# Patient Record
Sex: Female | Born: 1985 | Race: Black or African American | Hispanic: No | Marital: Single | Smoking: Never smoker
Health system: Southern US, Community
[De-identification: ages and names within clinical notes are randomized; demographics above are authoritative.]

## PROBLEM LIST (undated history)

## (undated) DIAGNOSIS — T7840XA Allergy, unspecified, initial encounter: Secondary | ICD-10-CM

## (undated) DIAGNOSIS — I8390 Asymptomatic varicose veins of unspecified lower extremity: Secondary | ICD-10-CM

## (undated) HISTORY — DX: Allergy, unspecified, initial encounter: T78.40XA

## (undated) HISTORY — PX: VASCULAR SURGERY: SHX849

## (undated) HISTORY — DX: Asymptomatic varicose veins of unspecified lower extremity: I83.90

---
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
## 2012-08-20 ENCOUNTER — Emergency Department (HOSPITAL_COMMUNITY)
Admission: EM | Admit: 2012-08-20 | Discharge: 2012-08-20 | Disposition: A | Discharge status: Home / Self Care | Payer: BC Managed Care – PPO | Attending: Emergency Medicine | Admitting: Emergency Medicine

## 2012-08-20 ENCOUNTER — Emergency Department (HOSPITAL_COMMUNITY): Payer: BC Managed Care – PPO

## 2012-08-20 ENCOUNTER — Encounter (HOSPITAL_COMMUNITY): Payer: Self-pay

## 2012-08-20 DIAGNOSIS — Y9239 Other specified sports and athletic area as the place of occurrence of the external cause: Secondary | ICD-10-CM | POA: Insufficient documentation

## 2012-08-20 DIAGNOSIS — W010XXA Fall on same level from slipping, tripping and stumbling without subsequent striking against object, initial encounter: Secondary | ICD-10-CM | POA: Insufficient documentation

## 2012-08-20 DIAGNOSIS — S93402A Sprain of unspecified ligament of left ankle, initial encounter: Secondary | ICD-10-CM

## 2012-08-20 DIAGNOSIS — S93409A Sprain of unspecified ligament of unspecified ankle, initial encounter: Secondary | ICD-10-CM | POA: Insufficient documentation

## 2012-08-20 DIAGNOSIS — Y92838 Other recreation area as the place of occurrence of the external cause: Secondary | ICD-10-CM | POA: Insufficient documentation

## 2012-08-20 DIAGNOSIS — Y9367 Activity, basketball: Secondary | ICD-10-CM | POA: Insufficient documentation

## 2012-08-20 MED ORDER — IBUPROFEN 600 MG PO TABS: 600.0000 mg | ORAL_TABLET | Freq: Three times a day (TID) | ORAL | Status: DC | PRN

## 2012-08-20 MED ORDER — IBUPROFEN 400 MG PO TABS
600.0000 mg | ORAL_TABLET | Freq: Once | ORAL | Status: AC
  Administered 2012-08-20: 600 mg via ORAL
  Filled 2012-08-20: qty 1

## 2012-08-20 MED ORDER — OXYCODONE-ACETAMINOPHEN 5-325 MG PO TABS: 1.0000 | ORAL_TABLET | Freq: Four times a day (QID) | ORAL | Status: DC | PRN

## 2012-08-20 MED ORDER — OXYCODONE-ACETAMINOPHEN 5-325 MG PO TABS
2.0000 | ORAL_TABLET | Freq: Once | ORAL | Status: DC
  Filled 2012-08-20: qty 2

## 2012-08-20 NOTE — ED Notes (Signed)
Palpable pulses and positive sensation below injury

## 2012-08-20 NOTE — ED Notes (Signed)
Pt c/o Right ankle pain radiating up into her calf, pt reports she was playing basket ball tonight and twisted her ankle.

## 2012-08-20 NOTE — ED Notes (Signed)
Pt called x 1 w/ no answer.   

## 2012-08-20 NOTE — ED Provider Notes (Signed)
Medical screening examination/treatment/procedure(s) were performed by non-physician practitioner and as supervising physician I was immediately available for consultation/collaboration.   Julie Manly, MD 08/20/12 0606 

## 2012-08-20 NOTE — ED Provider Notes (Cosign Needed)
   History    CSN: [id number] Arrival date & time 08/20/12  0027  First MD Initiated Contact with Patient 08/20/12 0158     Chief Complaint  Patient presents with  . Ankle Pain   (Consider location/radiation/quality/duration/timing/severity/associated sxs/prior Treatment) HPI History reviewed. No pertinent past medical history. History reviewed. No pertinent past surgical history. History reviewed. No pertinent family history. History  Substance Use Topics  . Smoking status: Never Smoker   . Smokeless tobacco: Not on file  . Alcohol Use: No   OB History   Grav Para Term Preterm Abortions TAB SAB Ect Mult Living                 Review of Systems  Allergies  Review of patient's allergies indicates no known allergies.  Home Medications   Current Outpatient Rx  Name  Route  Sig  Dispense  Refill  . ibuprofen (ADVIL,MOTRIN) 600 MG tablet   Oral   Take 1 tablet (600 mg total) by mouth every 8 (eight) hours as needed for pain.   30 tablet   0   . oxyCODONE-acetaminophen (PERCOCET/ROXICET) 5-325 MG per tablet   Oral   Take 1 tablet by mouth every 6 (six) hours as needed for pain.   18 tablet   0    BP 102/65  Pulse 82  Temp(Src) 97.8 F (36.6 C) (Oral)  Resp 18  Ht 5\' 8"  (1.727 m)  Wt 200 lb (90.719 kg)  BMI 30.42 kg/m2  SpO2 100%  LMP 07/20/2012 Physical Exam  ED Course  Procedures (including critical care time) Labs Reviewed - No data to display Dg Ankle Complete Left  08/20/2012   *RADIOLOGY REPORT*  Clinical Data: The lateral ankle pain.  LEFT ANKLE COMPLETE - 3+ VIEW  Comparison: None.  Findings: No acute bony abnormality.  Specifically, no fracture, subluxation, or dislocation.  Soft tissues are intact. Joint spaces are maintained.  Normal bone mineralization.  IMPRESSION: Negative.   Original Report Authenticated By: Charlett Nose, M.D.   1. Ankle sprain, left, initial encounter     MDM  Discussed with patient to see x-ray results.  She understands  the importance of wearing the brace for support.  For the next 4-6, weeks.  She will follow up with orthopedics as needed  Arman Filter, NP 08/20/12 0303  Arman Filter, NP 08/20/12 [phone]

## 2012-08-20 NOTE — ED Provider Notes (Signed)
History    CSN: [id number] Arrival date & time 08/20/12  0027  First MD Initiated Contact with Patient 08/20/12 0158     Chief Complaint  Patient presents with  . 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  She has not taken any medication.  Prior to arrival, but has applied ice and elevation  Patient is a 27 y.o. female presenting with ankle pain. The history is provided by the patient.  Ankle Pain Location:  Ankle Injury: yes   Ankle location:  L ankle Pain details:    Quality:  Aching   Radiates to:  Does not radiate   Severity:  Moderate Chronicity:  New Associated symptoms: no fever    History reviewed. No pertinent past medical history. History reviewed. No pertinent past surgical history. History reviewed. No pertinent family history. History  Substance Use Topics  . Smoking status: Never Smoker   . Smokeless tobacco: Not on file  . Alcohol Use: No   OB History   Grav Para Term Preterm Abortions TAB SAB Ect Mult Living                 Review of Systems  Constitutional: Negative for fever and chills.  Musculoskeletal: Positive for joint swelling.  Skin: Positive for color change.  All other systems reviewed and are negative.    Allergies  Review of patient's allergies indicates no known allergies.  Home Medications   Current Outpatient Rx  Name  Route  Sig  Dispense  Refill  . ibuprofen (ADVIL,MOTRIN) 600 MG tablet   Oral   Take 1 tablet (600 mg total) by mouth every 8 (eight) hours as needed for pain.   30 tablet   0   . oxyCODONE-acetaminophen (PERCOCET/ROXICET) 5-325 MG per tablet   Oral   Take 1 tablet by mouth every 6 (six) hours as needed for pain.   18 tablet   0    BP 102/65  Pulse 82  Temp(Src) 97.8 F (36.6 C) (Oral)  Resp 18  Ht 5\' 8"   (1.727 m)  Wt 200 lb (90.719 kg)  BMI 30.42 kg/m2  SpO2 100%  LMP 07/20/2012 Physical Exam  Nursing note and vitals reviewed. Constitutional: She appears well-developed and well-nourished.  HENT:  Head: Normocephalic.  Eyes: Pupils are equal, round, and reactive to light.  Neck: Normal range of motion.  Cardiovascular: Normal rate.   Pulmonary/Chest: Effort normal.  Musculoskeletal: She exhibits edema and tenderness.       Left ankle: She exhibits decreased range of motion and swelling. She exhibits no laceration and normal pulse. Tenderness.  Neurological: She is alert.  Skin: Skin is warm. No erythema.    ED Course  Procedures (including critical care time) Labs Reviewed - No data to display Dg Ankle Complete Left  08/20/2012   *RADIOLOGY REPORT*  Clinical Data: The lateral ankle pain.  LEFT ANKLE COMPLETE - 3+ VIEW  Comparison: None.  Findings: No acute bony abnormality.  Specifically, no fracture, subluxation, or dislocation.  Soft tissues are intact. Joint spaces are maintained.  Normal bone mineralization.  IMPRESSION: Negative.   Original Report Authenticated By: Charlett Nose, M.D.   1. Ankle sprain, left, initial encounter     MDM   Reviewed.  Patient placed in an ASO provided with crutches.  Discussed rehabilitation and rehabilitation exercises.  Follow up with orthopedics  Dondra Spry  Wynona Luna, NP 08/20/12 [phone]

## 2013-07-30 ENCOUNTER — Other Ambulatory Visit: Payer: Self-pay | Admitting: *Deleted

## 2013-07-30 DIAGNOSIS — I83893 Varicose veins of bilateral lower extremities with other complications: Secondary | ICD-10-CM

## 2013-08-14 ENCOUNTER — Encounter: Payer: Self-pay | Admitting: Vascular Surgery

## 2013-09-30 ENCOUNTER — Encounter: Payer: Self-pay | Admitting: Vascular Surgery

## 2013-10-01 ENCOUNTER — Encounter (INDEPENDENT_AMBULATORY_CARE_PROVIDER_SITE_OTHER): Payer: Self-pay

## 2013-10-01 ENCOUNTER — Encounter: Payer: Self-pay | Admitting: Vascular Surgery

## 2013-10-01 ENCOUNTER — Ambulatory Visit (INDEPENDENT_AMBULATORY_CARE_PROVIDER_SITE_OTHER): Payer: BC Managed Care – PPO | Admitting: Vascular Surgery

## 2013-10-01 ENCOUNTER — Ambulatory Visit (HOSPITAL_COMMUNITY)
Admission: RE | Admit: 2013-10-01 | Discharge: 2013-10-01 | Disposition: A | Discharge status: Home / Self Care | Payer: BC Managed Care – PPO | Source: Ambulatory Visit | Attending: Vascular Surgery | Admitting: Vascular Surgery

## 2013-10-01 VITALS — BP 113/84 | HR 83 | Ht 68.0 in | Wt 219.0 lb

## 2013-10-01 DIAGNOSIS — I83893 Varicose veins of bilateral lower extremities with other complications: Secondary | ICD-10-CM | POA: Insufficient documentation

## 2013-10-01 NOTE — Assessment & Plan Note (Signed)
This patient has significant reflux in her left greater saphenous vein and also some deep vein reflux. We've discussed the importance of intermittent leg elevation and the proper positioning for this. In addition we have discussed using compression stockings. I have written her a prescription for a knee-high stockings with a mild gradient. I've encouraged her to stay as active as possible to avoid prolonged sitting standing. If her symptoms progress I have asked her to call the office and try a thigh high compression stocking with a gradient of 20-30 mm of mercury. She could then be seen in follow up after that if needed. Our currently she did not wish to consider any further invasive treatment given that her symptoms are currently are tolerable. I quite I did not recommend sclerotherapy of her planned ectasias given that she has reflux and unless this were addressed they would be at high risk for recurrence.

## 2013-10-01 NOTE — Progress Notes (Signed)
Patient ID: Jamie Curtis, female   DOB: Oct 16, 1985, 28 y.o.   MRN: [id number]  Reason for Consult: Varicose veins left lower extremity   Referred by Georgann HousekeeperHusain, Karrar, MD  Subjective:     HPI:  Jamie Curtis is a 28 y.o. female Who states that she has had problems with varicose veins of her left leg for approximately a year now. She also has noted swelling in her left ankle. 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 She denies any history of DVT. She does have a family history of varicose veins in the both her mother and her sister had vein problems. She does describe some aching in her legs associated with standing and relieved somewhat with elevation.  I have reviewed her records from Dr. Laverle HobbyStoneking's office he has discussed with her the use of compression stockings which she has not tried yet.  Past Medical History  Diagnosis Date  . VV (varicose veins)    Family History  Problem Relation Age of Onset  . Hypertension Mother   . Cancer Father     melignant melanoma  . Diabetes Father   . Hypertension Father   . Deep vein thrombosis Sister   . Diabetes Brother    History reviewed. No pertinent past surgical history.  Short Social History:  History  Substance Use Topics  . Smoking status: Never Smoker   . Smokeless tobacco: Not on file  . Alcohol Use: No   No Known Allergies  Current Outpatient Prescriptions  Medication Sig Dispense Refill  . fluticasone (FLONASE) 50 MCG/ACT nasal spray Place 2 sprays into both nostrils daily.      Marland Kitchen. ibuprofen (ADVIL,MOTRIN) 600 MG tablet Take 1 tablet (600 mg total) by mouth every 8 (eight) hours as needed for pain.  30 tablet  0  . loratadine (CLARITIN) 10 MG tablet Take 10 mg by mouth daily as needed for allergies.      Marland Kitchen. oxyCODONE-acetaminophen (PERCOCET/ROXICET) 5-325 MG per tablet Take 1 tablet by mouth every 6 (six) hours as needed for pain.  18 tablet  0   No current  facility-administered medications for this visit.   Review of Systems  Constitutional: Negative for chills and fever.  Eyes: Negative for loss of vision.  Respiratory: Negative for cough and wheezing.  Cardiovascular: Negative for chest pain, chest tightness, claudication, dyspnea with exertion, orthopnea and palpitations.  GI: Negative for blood in stool and vomiting.  GU: Negative for dysuria and hematuria.  Musculoskeletal: Negative for leg pain, joint pain and myalgias.  Skin: Negative for rash and wound.  Neurological: Negative for dizziness and speech difficulty.  Hematologic: Negative for bruises/bleeds easily. Psychiatric: Negative for depressed mood.       Objective:  Objective  Filed Vitals:   10/01/13 1009  BP: 113/84  Pulse: 83  Height: 5\' 8"  (1.727 m)  Weight: 219 lb (99.338 kg)  SpO2: 100%   Body mass index is 33.31 kg/(m^2).  Physical Exam  Constitutional: She is oriented to person, place, and time. She appears well-developed and well-nourished.  HENT:  Head: Normocephalic and atraumatic.  Neck: Neck supple. No JVD present. No thyromegaly present.  Cardiovascular: Normal rate, regular rhythm and normal heart sounds.  Exam reveals no friction rub.   No murmur heard. Pulmonary/Chest: Breath sounds normal. She has no wheezes. She has no rales.  Abdominal: Soft. Bowel sounds are normal. There is no tenderness.  Musculoskeletal:  Normal range of motion. She exhibits no edema.  Lymphadenopathy:    She has no cervical adenopathy.  Neurological: She is alert and oriented to person, place, and time. She has normal strength. No sensory deficit.  Skin: No lesion and no rash noted.  She has some telangiectasias in her posterior left calf and medial left calf. She has some larger varicosities just above her foot on the anterior surface of her left leg.  Psychiatric: She has a normal mood and affect.    Data: I have independently interpreted her venous duplex scan of  the left lower extremity. This shows no evidence of DVT on the left. There is deep vein reflux. There is also significant reflux in the left greater saphenous vein.      Assessment/Plan:    Varicose veins of lower extremities with other complications This patient has significant reflux in her left greater saphenous vein and also some deep vein reflux. We've discussed the importance of intermittent leg elevation and the proper positioning for this. In addition we have discussed using compression stockings. I have written her a prescription for a knee-high stockings with a mild gradient. I've encouraged her to stay as active as possible to avoid prolonged sitting standing. If her symptoms progress I have asked her to call the office and try a thigh high compression stocking with a gradient of 20-30 mm of mercury. She could then be seen in follow up after that if needed. Our currently she did not wish to consider any further invasive treatment given that her symptoms are currently are tolerable. I quite I did not recommend sclerotherapy of her planned ectasias given that she has reflux and unless this were addressed they would be at high risk for recurrence.   Chuck Hint MD [facility]

## 2014-09-08 ENCOUNTER — Emergency Department (HOSPITAL_COMMUNITY)
Admission: EM | Admit: 2014-09-08 | Discharge: 2014-09-08 | Disposition: A | Discharge status: Home / Self Care | Payer: BC Managed Care – PPO | Attending: Emergency Medicine | Admitting: Emergency Medicine

## 2014-09-08 ENCOUNTER — Emergency Department (HOSPITAL_BASED_OUTPATIENT_CLINIC_OR_DEPARTMENT_OTHER): Payer: BC Managed Care – PPO

## 2014-09-08 ENCOUNTER — Encounter (HOSPITAL_COMMUNITY): Payer: Self-pay | Admitting: Neurology

## 2014-09-08 ENCOUNTER — Emergency Department (HOSPITAL_COMMUNITY): Admit: 2014-09-08 | Payer: BC Managed Care – PPO

## 2014-09-08 DIAGNOSIS — M7989 Other specified soft tissue disorders: Secondary | ICD-10-CM

## 2014-09-08 DIAGNOSIS — M79609 Pain in unspecified limb: Secondary | ICD-10-CM

## 2014-09-08 DIAGNOSIS — R2242 Localized swelling, mass and lump, left lower limb: Secondary | ICD-10-CM | POA: Diagnosis present

## 2014-09-08 DIAGNOSIS — Z7951 Long term (current) use of inhaled steroids: Secondary | ICD-10-CM | POA: Insufficient documentation

## 2014-09-08 DIAGNOSIS — I8312 Varicose veins of left lower extremity with inflammation: Secondary | ICD-10-CM | POA: Diagnosis not present

## 2014-09-08 MED ORDER — CEPHALEXIN 500 MG PO CAPS: 500.0000 mg | ORAL_CAPSULE | Freq: Four times a day (QID) | ORAL | Status: DC

## 2014-09-08 MED ORDER — IBUPROFEN 800 MG PO TABS: 800.0000 mg | ORAL_TABLET | Freq: Three times a day (TID) | ORAL | Status: DC

## 2014-09-08 NOTE — ED Notes (Signed)
Pt reports swelling to left calf since Saturday. Thinks its a problem with her varicose veins. Left calf is swollen, warm to touch, harder area to mid calf.

## 2014-09-08 NOTE — ED Provider Notes (Signed)
CSN: [id number]     Arrival date & time 09/08/14  1411 History   This chart was scribed for Catha GosselinHanna Patel-Mills, PA-C working with Eber HongBrian Miller, MD by Elveria Risingimelie Horne, ED Scribe. This patient was seen in room TR01C/TR01C and the patient's care was started at 4:40 PM.   Chief Complaint  Patient presents with  . Leg Swelling    The history is provided by the patient. No language interpreter was used.   HPI Comments: Jamie Curtis is a 29 y.o. female who presents to the Emergency Department complaining of swelling to left lower leg ongoing for three days now. Patient was evaluated at Urgent Care; discharged with recommendation for treatment with anti inflammatories. Patient reports localized firmness to medial lower leg which has worsened since her initial evaluation. Nothing makes her symptoms better or worse. Patient shares a history of varicose and attributes her current symptoms to this.   Past Medical History  Diagnosis Date  . VV (varicose veins)    History reviewed. No pertinent past surgical history. Family History  Problem Relation Age of Onset  . Hypertension Mother   . Cancer Father     melignant melanoma  . Diabetes Father   . Hypertension Father   . Deep vein thrombosis Sister   . Diabetes Brother    History  Substance Use Topics  . Smoking status: Never Smoker   . Smokeless tobacco: Not on file  . Alcohol Use: No   OB History    No data available     Review of Systems  Constitutional: Negative for fever and chills.  Respiratory: Negative for shortness of breath.   Cardiovascular: Positive for leg swelling. Negative for chest pain.  Neurological: Negative for seizures and weakness.  All other systems reviewed and are negative.     Allergies  Review of patient's allergies indicates no known allergies.  Home Medications   Prior to Admission medications   Medication Sig Start Date End Date Taking? Authorizing Provider  cephALEXin (KEFLEX) 500 MG capsule  Take 1 capsule (500 mg total) by mouth 4 (four) times daily. 09/08/14   Dymir Neeson Patel-Mills, PA-C  fluticasone (FLONASE) 50 MCG/ACT nasal spray Place 2 sprays into both nostrils daily.    Historical Provider, MD  ibuprofen (ADVIL,MOTRIN) 800 MG tablet Take 1 tablet (800 mg total) by mouth 3 (three) times daily. 09/08/14   Ozie Lupe Patel-Mills, PA-C  loratadine (CLARITIN) 10 MG tablet Take 10 mg by mouth daily as needed for allergies.    Historical Provider, MD  oxyCODONE-acetaminophen (PERCOCET/ROXICET) 5-325 MG per tablet Take 1 tablet by mouth every 6 (six) hours as needed for pain. 08/20/12   Earley FavorGail Schulz, NP   Triage Vitals: BP 123/94 mmHg  Pulse 63  Temp(Src) 98.3 F (36.8 C) (Oral)  Resp 16  SpO2 100%  LMP 08/23/2014 Physical Exam  Constitutional: She is oriented to person, place, and time. She appears well-developed and well-nourished. No distress.  HENT:  Head: Normocephalic and atraumatic.  Eyes: EOM are normal.  Neck: Neck supple. No tracheal deviation present.  Cardiovascular: Normal rate.   Pulmonary/Chest: Effort normal. No respiratory distress.  Musculoskeletal: Normal range of motion.  Tenderness to the left medial thigh with a palpable 2 cm area of firmness with surrounding erythema. No drainage. Good DP pulses bilaterally. Ambulatory with steady gait.  Neurological: She is alert and oriented to person, place, and time.  Skin: Skin is warm and dry.  Psychiatric: She has a normal mood and affect. Her behavior is  normal.  Nursing note and vitals reviewed.   ED Course  Procedures (including critical care time)  COORDINATION OF CARE: 6:43 PM- Discussed treatment plan with patient at bedside and patient agreed to plan.   Labs Review Labs Reviewed - No data to display  Imaging Review No results found.   EKG Interpretation None      MDM   Final diagnoses:  Varicose veins of left lower extremity with inflammation  Vitals stable. She is well-appearing and in no acute  distress. Ambulatory with steady gait after returning from Doppler. Ultrasound left lower extremity: Positive for superficial thrombus of the greater saphenous vein coursing from approximately 3 inches above the ankle to the knee. Also noted is a thrombosed varicosity in the mid calf at the level of the pain. I discussed wearing compression stockings. I gave the patient anabiotic's as well as Motrin for inflammation. She can follow-up with vascular and vein specialist. Patient verbally agrees with the plan.  I personally performed the services described in this documentation, which was scribed in my presence. The recorded information has been reviewed and is accurate.    Catha Gosselin, PA-C 09/08/14 1843  Eber Hong, MD 09/10/14 [phone]

## 2014-09-08 NOTE — Discharge Instructions (Signed)
Varicose Veins Take anti-inflammatory such as Motrin for inflammation. Take anabiotic says prescribed. Follow-up with vascular vein specialist. Varicose veins are veins that have become enlarged and twisted. CAUSES This condition is the result of valves in the veins not working properly. Valves in the veins help return blood from the leg to the heart. If these valves are damaged, blood flows backwards and backs up into the veins in the leg near the skin. This causes the veins to become larger. People who are on their feet a lot, who are pregnant, or who are overweight are more likely to develop varicose veins. SYMPTOMS   Bulging, twisted-appearing, bluish veins, most commonly found on the legs.  Leg pain or a feeling of heaviness. These symptoms may be worse at the end of the day.  Leg swelling.  Skin color changes. DIAGNOSIS  Varicose veins can usually be diagnosed with an exam of your legs by your caregiver. He or she may recommend an ultrasound of your leg veins. TREATMENT  Most varicose veins can be treated at home.However, other treatments are available for people who have persistent symptoms or who want to treat the cosmetic appearance of the varicose veins. These include:  Laser treatment of very small varicose veins.  Medicine that is shot (injected) into the vein. This medicine hardens the walls of the vein and closes off the vein. This treatment is called sclerotherapy. Afterwards, you may need to wear clothing or bandages that apply pressure.  Surgery. HOME CARE INSTRUCTIONS   Do not stand or sit in one position for long periods of time. Do not sit with your legs crossed. Rest with your legs raised during the day.  Wear elastic stockings or support hose. Do not wear other tight, encircling garments around the legs, pelvis, or waist.  Walk as much as possible to increase blood flow.  Raise the foot of your bed at night with 2-inch blocks.  If you get a cut in the skin  over the vein and the vein bleeds, lie down with your leg raised and press on it with a clean cloth until the bleeding stops. Then place a bandage (dressing) on the cut. See your caregiver if it continues to bleed or needs stitches. SEEK MEDICAL CARE IF:   The skin around your ankle starts to break down.  You have pain, redness, tenderness, or hard swelling developing in your leg over a vein.  You are uncomfortable due to leg pain. Document Released: 11/23/2004 Document Revised: 05/08/2011 Document Reviewed: 04/11/2010 [facility] Patient Information 2015 [place]. This information is not intended to replace advice given to you by your health care provider. Make sure you discuss any questions you have with your health care provider.

## 2014-09-08 NOTE — Progress Notes (Signed)
VASCULAR LAB PRELIMINARY  PRELIMINARY  PRELIMINARY  PRELIMINARY  Left lower extremity venous duplex completed.    Preliminary report: Left - No evidence of left lower extremity deep vein thrombosis. Positive for superficial thrombus of the greater saphenous vein coursing from approximately 3 inches above the ankle to the knee. Also noted is a thrombosed varicosity in the mid calf at the level of the pain and "knot".  Taye Cato, RVS 09/08/2014, 6:00 PM

## 2014-09-15 ENCOUNTER — Encounter: Payer: Self-pay | Admitting: Vascular Surgery

## 2014-09-15 ENCOUNTER — Ambulatory Visit (INDEPENDENT_AMBULATORY_CARE_PROVIDER_SITE_OTHER): Payer: BC Managed Care – PPO | Admitting: Vascular Surgery

## 2014-09-15 VITALS — BP 121/73 | HR 70 | Resp 16 | Ht 68.0 in | Wt 211.0 lb

## 2014-09-15 DIAGNOSIS — I83892 Varicose veins of left lower extremities with other complications: Secondary | ICD-10-CM

## 2014-09-15 NOTE — Progress Notes (Signed)
Subjective:     Patient ID: Jamie Curtis, female   DOB: [birth year], 29 y.o.   MRN: [id number]  HPI this 29 year old female is seen in follow-up for painful varicosities in the left leg. She was evaluated by Dr. Cari Carawayhris Dickson in August 2015 and was found to have gross reflux in the left great saphenous vein supplying painful varicosities in the calf area. She also was having edema. She was treated with long-leg elastic compression stockings 20-30 mm gradient as well as elevation and ibuprofen. She has continued to have symptoms. 2 weeks ago she developed severe pain over the saphenous vein from the knee to the ankle with acute thrombophlebitis being documented by duplex exam. She also continues to have swelling in the left ankle. She continues to worsen the long-leg stockings with no improvement in her symptoms. She has no history of bleeding or ulceration. She has no symptoms in the contralateral right leg.  Past Medical History  Diagnosis Date  . VV (varicose veins)     History  Substance Use Topics  . Smoking status: Never Smoker   . Smokeless tobacco: Not on file  . Alcohol Use: No    Family History  Problem Relation Age of Onset  . Hypertension Mother   . Cancer Father     melignant melanoma  . Diabetes Father   . Hypertension Father   . Deep vein thrombosis Sister   . Diabetes Brother     No Known Allergies   Current outpatient prescriptions:  .  cephALEXin (KEFLEX) 500 MG capsule, Take 1 capsule (500 mg total) by mouth 4 (four) times daily., Disp: 20 capsule, Rfl: 0 .  fluticasone (FLONASE) 50 MCG/ACT nasal spray, Place 2 sprays into both nostrils daily., Disp: , Rfl:  .  ibuprofen (ADVIL,MOTRIN) 800 MG tablet, Take 1 tablet (800 mg total) by mouth 3 (three) times daily., Disp: 21 tablet, Rfl: 0 .  loratadine (CLARITIN) 10 MG tablet, Take 10 mg by mouth daily as needed for allergies., Disp: , Rfl:  .  oxyCODONE-acetaminophen (PERCOCET/ROXICET) 5-325 MG per tablet, Take 1  tablet by mouth every 6 (six) hours as needed for pain. (Patient not taking: Reported on 09/15/2014), Disp: 18 tablet, Rfl: 0  Filed Vitals:   09/15/14 0913  BP: 121/73  Pulse: 70  Resp: 16  Height: 5\' 8"  (1.727 m)  Weight: 211 lb (95.709 kg)    Body mass index is 32.09 kg/(m^2).           Review of Systems denies chest pain, dyspnea on exertion, PND, orthopnea, hemoptysis    Objective:   Physical Exam BP 121/73 mmHg  Pulse 70  Resp 16  Ht 5\' 8"  (1.727 m)  Wt 211 lb (95.709 kg)  BMI 32.09 kg/m2  LMP 08/23/2014  Gen. well-developed well-nourished female in no apparent distress alert and oriented 3 Lungs no rhonchi or wheezing Cardiovascular rhythm no murmurs Left leg with tenderness to palpation from the knee to the medial malleolus over great saphenous vein with 1+ distal edema. No active ulceration noted. 3 posterior Sallis pedis pulse palpable.  Today I performed a bedside sinus site exam which reveals gross reflux in the left great saphenous vein from the knee to the saphenofemoral junction with a large caliber vein Previous formal ultrasound performed in August 2015 also documented gross reflux left great saphenous vein as described above     Assessment:     Painful thrombophlebitis left leg calf area with documented gross reflux left great saphenous  system. Chronic edema. Symptoms have been resistant to conservative measures including long-leg elastic compression stockings 20-30 millimeter gradient, elevation, and ibuprofen over the past 11 months. Symptoms are affecting her daily living and she continues to have pain from recent episode of thrombophlebitis    Plan:     Patient needs laser ablation left great saphenous vein to prevent further episodes of thrombophlebitis and hopefully improve edema. We will proceed with precertification to perform this in the near future

## 2014-09-16 ENCOUNTER — Other Ambulatory Visit: Payer: Self-pay | Admitting: *Deleted

## 2014-09-16 DIAGNOSIS — I83812 Varicose veins of left lower extremities with pain: Secondary | ICD-10-CM

## 2014-10-02 ENCOUNTER — Encounter: Payer: Self-pay | Admitting: Vascular Surgery

## 2014-10-05 ENCOUNTER — Encounter: Payer: Self-pay | Admitting: Vascular Surgery

## 2014-10-05 ENCOUNTER — Ambulatory Visit (INDEPENDENT_AMBULATORY_CARE_PROVIDER_SITE_OTHER): Payer: BC Managed Care – PPO | Admitting: Vascular Surgery

## 2014-10-05 VITALS — BP 114/85 | HR 72 | Temp 97.6°F | Resp 16 | Ht 68.0 in | Wt 210.0 lb

## 2014-10-05 DIAGNOSIS — I83892 Varicose veins of left lower extremities with other complications: Secondary | ICD-10-CM | POA: Diagnosis not present

## 2014-10-05 NOTE — Progress Notes (Signed)
Laser Ablation Procedure    Date: 10/05/2014   Jamie Curtis DOB:[birth year]  Consent signed: Yes    Surgeon:  Dr. Quita Skye. Hart [place]  Procedure: Laser Ablation: left Greater Saphenous Vein  BP 114/85 mmHg  Pulse 72  Temp(Src) 97.6 F (36.4 C)  Resp 16  Ht  (1.727 m)  Wt 210 lb (95.255 kg)  BMI 31.94 kg/m2  SpO2 100%  LMP 08/23/2014  Tumescent Anesthesia: 400 cc 0.9% NaCl with 50 cc Lidocaine HCL with 1% Epi and 15 cc 8.4% NaHCO3  Local Anesthesia: 5 cc Lidocaine HCL and NaHCO3 (ratio 2:1)  Pulsed Mode: 15 watts, delay, 1.0 duration  Total Energy:     1343         Total Pulses:    90            Total Time:  1:29     Patient tolerated procedure well  Notes:   Description of Procedure:  After marking the course of the secondary varicosities, the patient was placed on the operating table in the supine position, and the left leg was prepped and draped in sterile fashion.   Local anesthetic was administered and under ultrasound guidance the saphenous vein was accessed with a micro needle and guide wire; then the mirco puncture sheath was placed.  A guide wire was inserted saphenofemoral junction , followed by a 5 french sheath.  The position of the sheath and then the laser fiber below the junction was confirmed using the ultrasound.  Tumescent anesthesia was administered along the course of the saphenous vein using ultrasound guidance. The patient was placed in Trendelenburg position and protective laser glasses were placed on patient and staff, and the laser was fired at 15 watts continuous mode advancing 1-55mm/second for a total of 1343 joules.    Steri strips were applied to the stab wounds and ABD pads and thigh high compression stockings were applied.  Ace wrap bandages were applied over the phlebectomy sites and at the top of the saphenofemoral junction. Blood loss was less than 15 cc.  The patient ambulated out of the operating room having tolerated the procedure  well.

## 2014-10-05 NOTE — Progress Notes (Signed)
Subjective:     Patient ID: Jamie Curtis, female   DOB: 1985-04-05, 29 y.o.   MRN: [id number]  HPI this 29 year old female had laser ablation of the left great saphenous vein from the distal thigh to near the saphenofemoral junction performed under local tumescent anesthesia for gross reflux with a recent history of acute thrombophlebitis and varicosities in the left calf. She tolerated the procedure well. Total of 1343 J of energy was utilized.   Review of Systems     Objective:   Physical Exam BP 114/85 mmHg  Pulse 72  Temp(Src) 97.6 F (36.4 C)  Resp 16  Ht  (1.727 m)  Wt 210 lb (95.255 kg)  BMI 31.94 kg/m2  SpO2 100%  LMP 08/23/2014       Assessment:     Well-tolerated laser ablation left great saphenous vein performed under local tumescent anesthesia for gross reflux with recent thrombophlebitis    Plan:     Return in 1 week for venous duplex exam to confirm closure left great saphenous vein and this will complete patient's treatment regimen

## 2014-10-06 ENCOUNTER — Telehealth: Payer: Self-pay | Admitting: *Deleted

## 2014-10-06 NOTE — Telephone Encounter (Signed)
Pt doing well and following all instructions. Having just a little pain. Reminded her of her fu appts.

## 2014-10-09 ENCOUNTER — Encounter: Payer: Self-pay | Admitting: Vascular Surgery

## 2014-10-12 ENCOUNTER — Encounter: Payer: Self-pay | Admitting: Vascular Surgery

## 2014-10-12 ENCOUNTER — Ambulatory Visit (HOSPITAL_COMMUNITY)
Admission: RE | Admit: 2014-10-12 | Discharge: 2014-10-12 | Disposition: A | Discharge status: Home / Self Care | Payer: BC Managed Care – PPO | Source: Ambulatory Visit | Attending: Vascular Surgery | Admitting: Vascular Surgery

## 2014-10-12 ENCOUNTER — Ambulatory Visit (INDEPENDENT_AMBULATORY_CARE_PROVIDER_SITE_OTHER): Payer: BC Managed Care – PPO | Admitting: Vascular Surgery

## 2014-10-12 VITALS — BP 105/79 | HR 72 | Temp 97.8°F | Resp 16 | Ht 68.0 in | Wt 210.0 lb

## 2014-10-12 DIAGNOSIS — I83812 Varicose veins of left lower extremities with pain: Secondary | ICD-10-CM | POA: Diagnosis not present

## 2014-10-12 DIAGNOSIS — I83892 Varicose veins of left lower extremities with other complications: Secondary | ICD-10-CM

## 2014-10-12 NOTE — Progress Notes (Signed)
Subjective:     Patient ID: Jamie Curtis, female   DOB: [birth year], 29 y.o.   MRN: [id number]  HPI this 29 year old female returns 1 week post-laser ablation left great saphenous vein from distal thigh to near the saphenofemoral junction. She had recently developed acute thrombophlebitis and some varicosities in the left medial calf and had pain and swelling. He has done well in the past week with her only complaint being mild to moderate discomfort in the medial thigh proximally. This seems to be improving. She's had no distal edema. She has taken her ibuprofen as instructed and worn elastic compression stockings.  Past Medical History  Diagnosis Date  . VV (varicose veins)     Social History  Substance Use Topics  . Smoking status: Never Smoker   . Smokeless tobacco: Not on file  . Alcohol Use: No    Family History  Problem Relation Age of Onset  . Hypertension Mother   . Cancer Father     melignant melanoma  . Diabetes Father   . Hypertension Father   . Deep vein thrombosis Sister   . Diabetes Brother     No Known Allergies   Current outpatient prescriptions:  .  cephALEXin (KEFLEX) 500 MG capsule, Take 1 capsule (500 mg total) by mouth 4 (four) times daily., Disp: 20 capsule, Rfl: 0 .  fluticasone (FLONASE) 50 MCG/ACT nasal spray, Place 2 sprays into both nostrils daily., Disp: , Rfl:  .  ibuprofen (ADVIL,MOTRIN) 800 MG tablet, Take 1 tablet (800 mg total) by mouth 3 (three) times daily., Disp: 21 tablet, Rfl: 0 .  loratadine (CLARITIN) 10 MG tablet, Take 10 mg by mouth daily as needed for allergies., Disp: , Rfl:  .  oxyCODONE-acetaminophen (PERCOCET/ROXICET) 5-325 MG per tablet, Take 1 tablet by mouth every 6 (six) hours as needed for pain. (Patient not taking: Reported on 09/15/2014), Disp: 18 tablet, Rfl: 0  Filed Vitals:   10/12/14 1358  BP: 105/79  Pulse: 72  Temp: 97.8 F (36.6 C)  Resp: 16  Height:  (1.727 m)  Weight: 210 lb (95.255 kg)  SpO2: 100%     Body mass index is 31.94 kg/(m^2).           Review of Systems denies chest pain, dyspnea on exertion, PND, orthopnea, hemoptysis. Denies claudication     Objective:   Physical Exam BP 105/79 mmHg  Pulse 72  Temp(Src) 97.8 F (36.6 C)  Resp 16  Ht  (1.727 m)  Wt 210 lb (95.255 kg)  BMI 31.94 kg/m2  SpO2 100%  Gen. well-developed well-nourished female in no apparent distress alert and oriented 3 Lungs no rhonchi or wheezing Cardiovascular regular rhythm no murmurs Left leg with mild discomfort palpation of great saphenous vein beginning in proximal thigh extending to distal thigh. No distal edema noted. 3+ to Salas pedis pulse palpable.  Today I ordered a venous duplex exam the left leg which I reviewed and interpreted. There is no DVT. There is total closure of the great saphenous vein from the distal thigh to near the saphenofemoral junction       Assessment:     Successful laser ablation left great saphenous vein for gross reflux in patient with 3 simple acute thrombophlebitis of varicosities in left medial calf    Plan:     Return to see me on when necessary basis

## 2015-04-09 ENCOUNTER — Telehealth: Payer: Self-pay | Admitting: *Deleted

## 2015-04-09 NOTE — Telephone Encounter (Signed)
Jamie Curtis states she started experiencing left leg (especially left ankle) pain on 04-04-2015 and left leg swelling on 04-06-2015. She states she is able to wear her shoe without difficulty.  She states she had similar symptoms prior to her laser ablation in August, 2016.  She states she is not experiencing redness or warmth to touch in left leg/left ankle area.  States she started wearing her compression hose on Wednesday and her left leg feels much better when wearing her compression hose. Advised her to continue wearing the compression hose daytime and to keep her left leg elevated.  Also recommended taking Ibuprofen 600 mg three times daily with meals.  Advised her to call [facility] 04-12-2015 (Monday) if symptoms don't improve.  Mrs. Bui verbalized understanding.

## 2015-07-28 ENCOUNTER — Ambulatory Visit (INDEPENDENT_AMBULATORY_CARE_PROVIDER_SITE_OTHER): Payer: BC Managed Care – PPO | Admitting: Physician Assistant

## 2015-07-28 VITALS — BP 108/82 | HR 75 | Temp 97.5°F | Resp 16 | Ht 68.0 in | Wt 207.2 lb

## 2015-07-28 DIAGNOSIS — Z113 Encounter for screening for infections with a predominantly sexual mode of transmission: Secondary | ICD-10-CM | POA: Diagnosis not present

## 2015-07-28 NOTE — Patient Instructions (Signed)
     IF you received an x-ray today, you will receive an invoice from [facility]. Please contact [facility] at [phone] with questions or concerns regarding your invoice.   IF you received labwork today, you will receive an invoice from [facility]. Please contact [facility] at [phone] with questions or concerns regarding your invoice.   Our billing staff will not be able to assist you with questions regarding bills from these companies.  You will be contacted with the lab results as soon as they are available. The fastest way to get your results is to activate your My Chart account. Instructions are located on the last page of this paperwork. If you have not heard from us regarding the results in 2 weeks, please contact this office.      

## 2015-07-28 NOTE — Progress Notes (Signed)
Subjective:    Patient ID: Jamie Curtis, female    DOB: October 09, 1985, 30 y.o.   MRN: [id number]  Chief Complaint  Patient presents with  . Tested for Herpes   HPI  Patient presents today for STI testing following a sexual encounter last night with a Herpes + female partner. Patient is asymptomatic, but requesting testing "just to be sure."  She reports being unsure whether partner had lesions present at the time of the encounter, and has only had 1 sexual encounter with this partner.  No other new partners in last 9 months.  Denies lesions, vaginal discharge, fever, cold symptoms, abdominal or vaginal pain, and body aches.    Sexual history Total 4 partners lifetime, both female and female. No history of STI.    Review of Systems  Constitutional: Negative for fever and chills.  Gastrointestinal: Negative for nausea, vomiting and diarrhea.  Genitourinary: Negative for hematuria, vaginal discharge, difficulty urinating, genital sores and pelvic pain.  Musculoskeletal: Negative for myalgias.     Patient Active Problem List   Diagnosis Date Noted  . Varicose veins of left lower extremity with complications 10/05/2014  . Varicose veins of lower extremities with other complications 10/01/2013     Current Outpatient Prescriptions on File Prior to Visit  Medication Sig Dispense Refill  . ibuprofen (ADVIL,MOTRIN) 800 MG tablet Take 1 tablet (800 mg total) by mouth 3 (three) times daily. 21 tablet 0  . loratadine (CLARITIN) 10 MG tablet Take 10 mg by mouth daily as needed for allergies.    . fluticasone (FLONASE) 50 MCG/ACT nasal spray Place 2 sprays into both nostrils daily. Reported on 07/28/2015    . oxyCODONE-acetaminophen (PERCOCET/ROXICET) 5-325 MG per tablet Take 1 tablet by mouth every 6 (six) hours as needed for pain. (Patient not taking: Reported on 09/15/2014) 18 tablet 0   No current facility-administered medications on file prior to visit.   No Known Allergies      Objective: BP 108/82 mmHg  Pulse 75  Temp(Src) 97.5 F (36.4 C) (Oral)  Resp 16  Ht 5\' 8"  (1.727 m)  Wt 207 lb 3.2 oz (93.985 kg)  BMI 31.51 kg/m2  SpO2 99%  LMP 07/20/2015   Physical Exam  Constitutional: She is oriented to person, place, and time. She appears well-developed and well-nourished.  Cardiovascular: Normal rate, regular rhythm, normal heart sounds and intact distal pulses.   Pulmonary/Chest: Effort normal and breath sounds normal.  Neurological: She is alert and oriented to person, place, and time.  Psychiatric: She has a normal mood and affect. Her behavior is normal. Judgment and thought content normal.    Pelvic exam deferred     Assessment & Plan:  1. Routine screening for STI (sexually transmitted infection) Discussed types of STI testing and indications of results with patient, and next steps following results.  Educated her that because she had no lesions present, a blood test for herpes would be the only test possible.  Also because it is so soon after the sexual encounter, if any of the results were to return positive it would be indicative that she had the infection prior to this new partner.  Patient requested to go ahead a be tested for everything.  Management will be determined pending results. Labs collected and pending include: - GC/Chlamydia Probe Amp - Gonococcus culture - Hepatitis B surface antibody - Hepatitis B surface antigen - Hepatitis C antibody - HIV antibody - HSV(herpes simplex vrs) 1+2 ab-IgG - RPR  Patient will  be contacted with results and management options will be discussed and given as indicated.  Gianny Killman P. Devaughn Savant, PA-S

## 2015-07-29 LAB — HEPATITIS B SURFACE ANTIGEN: HEP B S AG: NEGATIVE

## 2015-07-29 LAB — HEPATITIS C ANTIBODY: HCV Ab: NEGATIVE

## 2015-07-29 LAB — HEPATITIS B SURFACE ANTIBODY, QUANTITATIVE: HEPATITIS B-POST: 0 m[IU]/mL

## 2015-07-29 LAB — HIV ANTIBODY (ROUTINE TESTING W REFLEX): HIV 1&2 Ab, 4th Generation: NONREACTIVE

## 2015-07-30 LAB — HSV(HERPES SIMPLEX VRS) I + II AB-IGG: HSV 1 Glycoprotein G Ab, IgG: <0.9 Index (ref <0.90)

## 2015-07-30 LAB — RPR

## 2015-07-30 LAB — GC/CHLAMYDIA PROBE AMP
CT Probe RNA: NOT DETECTED
GC PROBE AMP APTIMA: NOT DETECTED

## 2015-08-01 LAB — GONOCOCCUS CULTURE: ORGANISM ID, BACTERIA: NO GROWTH

## 2015-08-01 NOTE — Progress Notes (Signed)
   Patient ID: Jamie Curtis, female    DOB: June 12, 1985, 30 y.o.   MRN: [id number]  PCP: Georgann HousekeeperHUSAIN,KARRAR, MD  Subjective:   Chief Complaint  Patient presents with  . Tested for Herpes    HPI Patient presents today for STI testing following a sexual encounter last night with a Herpes + female partner. She is accompanied by a female friend.  Patient is asymptomatic, but requesting testing "just to be sure."  She reports being unsure whether partner had lesions present at the time of the encounter, and has only had 1 sexual encounter with this partner, yesterday. No other new partners in last 9 months.   Denies lesions, vaginal discharge, fever, cold symptoms, abdominal or vaginal pain, and body aches.    Total 4 partners lifetime, both female and female.  No history of STI.       Review of Systems Asymptomatic, other than anxiety.    Patient Active Problem List   Diagnosis Date Noted  . Varicose veins of left lower extremity with complications 10/05/2014  . Varicose veins of lower extremities with other complications 10/01/2013     Prior to Admission medications   Medication Sig Start Date End Date Taking? Authorizing Provider  ibuprofen (ADVIL,MOTRIN) 800 MG tablet Take 1 tablet (800 mg total) by mouth 3 (three) times daily. 09/08/14  Yes Hanna Patel-Mills, PA-C  loratadine (CLARITIN) 10 MG tablet Take 10 mg by mouth daily as needed for allergies.   Yes Historical Provider, MD     No Known Allergies     Objective:  Physical Exam  Constitutional: She is oriented to person, place, and time. She appears well-developed and well-nourished. She is active and cooperative. No distress.  BP 108/82 mmHg  Pulse 75  Temp(Src) 97.5 F (36.4 C) (Oral)  Resp 16  Ht 5\' 8"  (1.727 m)  Wt 207 lb 3.2 oz (93.985 kg)  BMI 31.51 kg/m2  SpO2 99%  LMP 07/20/2015   Eyes: Conjunctivae are normal.  Pulmonary/Chest: Effort normal.  Neurological: She is alert and oriented to person,  place, and time.  Psychiatric: She has a normal mood and affect. Her speech is normal and behavior is normal.           Assessment & Plan:   1. Routine screening for STI (sexually transmitted infection) Lengthy discussion regarding herpes and its diagnosis and treatment, what positive and negative serology results mean.  - GC/Chlamydia Probe Amp - Gonococcus culture - Hepatitis B surface antibody - Hepatitis B surface antigen - Hepatitis C antibody - HIV antibody - HSV(herpes simplex vrs) 1+2 ab-IgG - RPR   Fernande Brashelle S. Lorella Gomez, PA-C Physician Assistant-Certified [facility]

## 2015-11-06 ENCOUNTER — Encounter: Payer: Self-pay | Admitting: Family Medicine

## 2015-11-06 ENCOUNTER — Ambulatory Visit (INDEPENDENT_AMBULATORY_CARE_PROVIDER_SITE_OTHER): Payer: BC Managed Care – PPO | Admitting: Family Medicine

## 2015-11-06 VITALS — BP 110/74 | HR 77 | Temp 98.3°F | Resp 18 | Ht 69.25 in | Wt 229.0 lb

## 2015-11-06 DIAGNOSIS — Z113 Encounter for screening for infections with a predominantly sexual mode of transmission: Secondary | ICD-10-CM | POA: Diagnosis not present

## 2015-11-06 LAB — POCT WET + KOH PREP
Trich by wet prep: ABSENT
YEAST BY KOH: ABSENT
YEAST BY WET PREP: ABSENT

## 2015-11-06 MED ORDER — CLOTRIMAZOLE 1 % VA CREA: 1.0000 | TOPICAL_CREAM | Freq: Every day | VAGINAL | 0 refills | Status: DC

## 2015-11-06 NOTE — Progress Notes (Signed)
Patient ID: Jamie Curtis, female    DOB: 03/21/1985, 30 y.o.   MRN: [id number]  PCP: Georgann HousekeeperHUSAIN,KARRAR, MD  Chief Complaint  Patient presents with  . STD CHECK    no symptoms per pt    Subjective:   HPI 30 year old female presents for HSV testing and a vaginal exam due to vaginal discharge and itching.   She reports vaginal itching for a couple days with a thickened white discharge. She also reports the sensation that there is some type of a bump in her vagina and request a vaginal exam and HSV testing.  She denies use of any over-the-counter anti-itch or antifungal creams. Patient declines any additional STD tests today.  Social History   Social History  . Marital status: Unknown    Spouse name: N/A  . Number of children: N/A  . Years of education: N/A   Occupational History  . Not on file.   Social History Main Topics  . Smoking status: Never Smoker  . Smokeless tobacco: Not on file  . Alcohol use No  . Drug use: No  . Sexual activity: Yes    Birth control/ protection: None   Other Topics Concern  . Not on file   Social History Narrative  . No narrative on file   . Family History  Problem Relation Age of Onset  . Hypertension Mother   . Cancer Father     melignant melanoma  . Diabetes Father   . Hypertension Father   . Diabetes Maternal Grandmother   . Heart disease Paternal Grandmother   . Deep vein thrombosis Sister   . Diabetes Brother   . Stroke Maternal Grandfather    Review of Systems See history of present illness    Patient Active Problem List   Diagnosis Date Noted  . Varicose veins of left lower extremity with complications 10/05/2014  . Varicose veins of lower extremities with other complications 10/01/2013     Prior to Admission medications   Medication Sig Start Date End Date Taking? Authorizing Provider  fluticasone (FLONASE) 50 MCG/ACT nasal spray Place 2 sprays into both nostrils daily. Reported on 07/28/2015   Yes Historical  Provider, MD  loratadine (CLARITIN) 10 MG tablet Take 10 mg by mouth daily as needed for allergies.   Yes Historical Provider, MD  oxyCODONE-acetaminophen (PERCOCET/ROXICET) 5-325 MG per tablet Take 1 tablet by mouth every 6 (six) hours as needed for pain. Patient not taking: Reported on 09/15/2014 08/20/12   Earley FavorGail Schulz, NP   No Known Allergies     Objective:  Physical Exam  Constitutional: She is oriented to person, place, and time. She appears well-developed and well-nourished.  HENT:  Head: Normocephalic and atraumatic.  Right Ear: External ear normal.  Left Ear: External ear normal.  Eyes: Conjunctivae are normal.  Neck: Normal range of motion. Neck supple.  Cardiovascular: Normal rate.   Pulmonary/Chest: Effort normal.  Genitourinary: Vaginal discharge found.  Genitourinary Comments: Normal female external genitalia without lesion. No inguinal lymphadenopathy. Vaginal mucosa is pink and moist without lesions. Cervix absent of lesion, color consistent with mucosa, white thin discharge present,  not friable.  No cervical motion tenderness, adnexal fullness or tenderness. Specimen for wet prep obtained.  Musculoskeletal: Normal range of motion.  Neurological: She is alert and oriented to person, place, and time.  Skin: Skin is warm.  Psychiatric: She has a normal mood and affect. Her behavior is normal. Judgment and thought content normal.   Vitals:   11/06/15 1510  BP: 110/74  Pulse: 77  Resp: 18  Temp: 98.3 F (36.8 C)     Assessment & Plan:  1. Screening examination for STD (sexually transmitted disease)  - HSV(herpes simplex vrs) 1+2 ab-IgG-negative  - POCT Wet + KOH Prep-negative  Plan: For vaginal itching start: . clotrimazole (GYNE-LOTRIMIN) 1 % vaginal cream    Sig: Place 1 Applicatorful vaginally at bedtime. For itching.  Use barrier protection with sexual encounters to prevent development of STDs.  Godfrey Pick. Tiburcio Pea, MSN, FNP-C [facility]

## 2015-11-06 NOTE — Patient Instructions (Addendum)
I will call with your test results.    IF you received an x-ray today, you will receive an invoice from [facility]. Please contact [facility] at [phone] with questions or concerns regarding your invoice.   IF you received labwork today, you will receive an invoice from [facility]. Please contact [facility] at [phone] with questions or concerns regarding your invoice.   Our billing staff will not be able to assist you with questions regarding bills from these companies.  You will be contacted with the lab results as soon as they are available. The fastest way to get your results is to activate your My Chart account. Instructions are located on the last page of this paperwork. If you have not heard from us regarding the results in 2 weeks, please contact this office.    Genital Herpes Genital herpes is a common sexually transmitted infection (STI) that is caused by a virus. The virus is spread from person to person through sexual contact. Infection can cause itching, blisters, and sores in the genital area or rectal area. This is called an outbreak. It affects both men and women. Genital herpes is particularly concerning for pregnant women because the virus can be passed to the baby during delivery and cause serious problems. Genital herpes is also a concern for people with a weakened defense (immune) system. Symptoms of genital herpes may last several days and then go away. However, the virus remains in your body, so you may have more outbreaks of symptoms in the future. The time between outbreaks varies and can be months or years. CAUSES Genital herpes is caused by a virus called herpes simplex virus (HSV) type 2 or HSV type 1. These viruses are contagious and are most often spread through sexual contact with an infected person. Sexual contact includes vaginal, anal, and oral sex. RISK FACTORS Risk factors for genital herpes include:  Being  sexually active with multiple partners.  Having unprotected sex. SIGNS AND SYMPTOMS Symptoms may include:  Pain and itching in the genital area or rectal area.  Small red bumps that turn into blisters and then turn into sores.  Flu-like symptoms, including:  Fever.  Body aches.  Painful urination.  Vaginal discharge. DIAGNOSIS Genital herpes may be diagnosed by:  Physical exam.  Blood test.  Fluid culture test from an open sore. TREATMENT There is no cure for genital herpes. Oral antiviral medicines may be used to speed up healing and to help prevent the return of symptoms. These medicines can also help to reduce the spread of the virus to sexual partners. HOME CARE INSTRUCTIONS  Keep the affected areas dry and clean.  Take medicines only as directed by your health care provider.  Do not have sexual contact during active infections. Genital herpes is contagious.  Practice safe sex. Latex condoms and female condoms may help to prevent the spread of the herpes virus.  Avoid rubbing or touching the blisters and sores. If you do touch the blister or sores:  Wash your hands thoroughly.  Do not touch your eyes afterward.  If you become pregnant, tell your health care provider if you have had genital herpes.  Keep all follow-up visits as directed by your health care provider. This is important. PREVENTION  Use condoms. Although anyone can contract genital herpes during sexual contact even with the use of a condom, a condom can provide some protection.  Avoid having multiple sexual partners.  Talk to your sexual partner about any symptoms  and past history that either of you may have.  Get tested before you have sex. Ask your partner to do the same.  Recognize the symptoms of genital herpes. Do not have sexual contact if you notice these symptoms. SEEK MEDICAL CARE IF:  Your symptoms are not improving with medicine.  Your symptoms return.  You have new  symptoms.  You have a fever.  You have abdominal pain.  You have redness, swelling, or pain in your eye. MAKE SURE YOU:  Understand these instructions.  Will watch your condition.  Will get help right away if you are not doing well or get worse.   This information is not intended to replace advice given to you by your health care provider. Make sure you discuss any questions you have with your health care provider.   Document Released: 02/11/2000 Document Revised: 03/06/2014 Document Reviewed: 07/01/2013 Elsevier Interactive Patient Education [employer]

## 2015-11-08 LAB — HSV(HERPES SIMPLEX VRS) I + II AB-IGG
HSV 1 Glycoprotein G Ab, IgG: <0.9 Index (ref <0.90)
HSV 2 Glycoprotein G Ab, IgG: <0.9 Index (ref <0.90)

## 2015-11-10 ENCOUNTER — Encounter: Payer: Self-pay | Admitting: Family Medicine

## 2015-11-10 NOTE — Progress Notes (Signed)
November 10, 2015   Jamie LenisCammie J Moilanen [address] [place] [address]   Dear Ms. Polizzi,  Below are the results from your recent visit were normal.  Resulted Orders  HSV(herpes simplex vrs) 1+2 ab-IgG  Result Value Ref Range   HSV 1 Glycoprotein G Ab, IgG <0.90 <0.90 Index     Comment:                          Index             Interpretation                    =====             ==============                    < 0.90               NEGATIVE                    0.90 - 1.09          EQUIVOCAL                    > 1.09               POSITIVE    HSV 2 Glycoprotein G Ab, IgG <0.90 <0.90 Index     Comment:                          Index             Interpretation                    =====             ==============                    < 0.90               NEGATIVE                    0.90 - 1.09          EQUIVOCAL                    > 1.09               POSITIVE This assay utilizes recombinant type-specific antigens to differentiate HSV-1 from HSV-2 infections. A positive result cannot distinguish between recent and past infection. If recent HSV infection is suspected but the results are negative or equivocal, the assay should be repeated in 4-6 weeks. The performance characteristics of the assay have not been established for pediatric populations, immunocompromised patients, or neonatal screening.      Narrative   Performed at:  [facility]                [address], [address]                [place], [place] [address]     If you have any questions or concerns, please don't hesitate to call.  Sincerely,   Godfrey PickKimberly S. Tiburcio PeaHarris, MSN, FNP-C [facility]

## 2016-04-12 ENCOUNTER — Ambulatory Visit (INDEPENDENT_AMBULATORY_CARE_PROVIDER_SITE_OTHER): Payer: BC Managed Care – PPO

## 2016-04-12 ENCOUNTER — Ambulatory Visit (INDEPENDENT_AMBULATORY_CARE_PROVIDER_SITE_OTHER): Payer: BC Managed Care – PPO | Admitting: Family Medicine

## 2016-04-12 VITALS — BP 110/80 | HR 84 | Temp 98.4°F | Resp 16 | Ht 68.0 in | Wt 227.2 lb

## 2016-04-12 DIAGNOSIS — M545 Low back pain: Secondary | ICD-10-CM | POA: Diagnosis not present

## 2016-04-12 DIAGNOSIS — R079 Chest pain, unspecified: Secondary | ICD-10-CM | POA: Diagnosis not present

## 2016-04-12 MED ORDER — CYCLOBENZAPRINE HCL 10 MG PO TABS: 5.0000 mg | ORAL_TABLET | Freq: Three times a day (TID) | ORAL | 0 refills | Status: DC | PRN

## 2016-04-12 MED ORDER — IBUPROFEN 600 MG PO TABS: 600.0000 mg | ORAL_TABLET | Freq: Three times a day (TID) | ORAL | 0 refills | Status: DC | PRN
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
## 2016-04-12 NOTE — Patient Instructions (Addendum)
Your chest xray looked great.  We are treating this like a muscle spasm in your back.  Take the Ibuprofen 600 mg every 8 hours or so for pain relief and anti-inflammatory effects.  Take the Flexeril as a muscle relaxer at night. This may make you drowsy and if it does do not take it during the day.  Heat and massage are also great to help relieve the pain.  This can last for the next 7-10 days. If you're still having issues in the next 2 weeks come back and see us.  If you start having worsening pain despite the treatment don't wait and come back immediately.     IF you received an x-ray today, you will receive an invoice from [facility]. Please contact [facility] at [phone] with questions or concerns regarding your invoice.   IF you received labwork today, you will receive an invoice from [facility]. Please contact [facility] at [phone] with questions or concerns regarding your invoice.   Our billing staff will not be able to assist you with questions regarding bills from these companies.  You will be contacted with the lab results as soon as they are available. The fastest way to get your results is to activate your My Chart account. Instructions are located on the last page of this paperwork. If you have not heard from us regarding the results in 2 weeks, please contact this office.

## 2016-09-15 ENCOUNTER — Encounter: Payer: Self-pay | Admitting: Physician Assistant

## 2016-09-15 ENCOUNTER — Ambulatory Visit (INDEPENDENT_AMBULATORY_CARE_PROVIDER_SITE_OTHER): Payer: BC Managed Care – PPO | Admitting: Physician Assistant

## 2016-09-15 VITALS — BP 115/80 | HR 62 | Temp 98.7°F | Resp 16 | Ht 68.0 in | Wt 224.8 lb

## 2016-09-15 DIAGNOSIS — K625 Hemorrhage of anus and rectum: Secondary | ICD-10-CM | POA: Diagnosis not present

## 2016-09-15 LAB — HEMOCCULT GUIAC POC 1CARD (OFFICE): FECAL OCCULT BLD: POSITIVE — AB

## 2016-09-15 MED ORDER — PANTOPRAZOLE SODIUM 40 MG PO TBEC: DELAYED_RELEASE_TABLET | ORAL | 3 refills | Status: AC

## 2016-09-15 NOTE — Patient Instructions (Signed)
     IF you received an x-ray today, you will receive an invoice from [facility]. Please contact [facility] at [phone] with questions or concerns regarding your invoice.   IF you received labwork today, you will receive an invoice from [facility]. Please contact [facility] at [phone] with questions or concerns regarding your invoice.   Our billing staff will not be able to assist you with questions regarding bills from these companies.  You will be contacted with the lab results as soon as they are available. The fastest way to get your results is to activate your My Chart account. Instructions are located on the last page of this paperwork. If you have not heard from us regarding the results in 2 weeks, please contact this office.     

## 2016-09-15 NOTE — Progress Notes (Signed)
09/15/2016 4:01 PM   DOB: 09-01-1985 / MRN: [id number]  SUBJECTIVE:  Jamie Curtis is a 31 y.o. female presenting for blood in the stool.  Tells me this was red but not bright red.  Denies constipation or diarrhea.  [place] and wants this problem resolved today. Tells me she is not having any pain with wiping however there is some medium red blood on the tolilet tissue. Denies a history of hemorrhoid. She is not sexually active at this time. No nsaids.  Did eat a pizza meal that immediately gave her abdominal pain and later had diarrhea.   She has No Known Allergies.   She  has a past medical history of Allergy and VV (varicose veins).    She  reports that she has never smoked. She has never used smokeless tobacco. She reports that she does not drink alcohol or use drugs. She  reports that she currently engages in sexual activity. She reports using the following method of birth control/protection: None. The patient  has a past surgical history that includes Vascular surgery.  Her family history includes Cancer in her father; Deep vein thrombosis in her sister; Diabetes in her brother, father, and maternal grandmother; Heart disease in her paternal grandmother; Hypertension in her father and mother; Stroke in her maternal grandfather.  Review of Systems  Constitutional: Negative for chills and fever.  Respiratory: Negative for cough.   Gastrointestinal: Positive for abdominal pain (yesterday, better today), blood in stool and diarrhea. Negative for constipation, heartburn, melena, nausea and vomiting.  Skin: Negative for itching and rash.  Neurological: Negative for dizziness.    The problem list and medications were reviewed and updated by myself where necessary and exist elsewhere in the encounter.   OBJECTIVE:  BP 115/80 (BP Location: Right Arm, Patient Position: Sitting, Cuff Size: Normal)   Pulse 62   Temp 98.7 F (37.1 C) (Oral)   Resp 16   Ht 5\' 8"   (1.727 m)   Wt 224 lb 12.8 oz (102 kg)   LMP 06/12/2016   SpO2 98%   BMI 34.18 kg/m   Pulse Readings from Last 3 Encounters:  09/15/16 62  04/12/16 84  11/06/15 77   BP Readings from Last 3 Encounters:  09/15/16 115/80  04/12/16 110/80  11/06/15 110/74   Physical Exam  Constitutional: She is active.  Non-toxic appearance.  Cardiovascular: Normal rate, regular rhythm, S1 normal, S2 normal, normal heart sounds and intact distal pulses.  Exam reveals no gallop, no friction rub and no decreased pulses.   No murmur heard. Pulmonary/Chest: Effort normal. No tachypnea. She has no rales.  Abdominal: Soft. Normal appearance and bowel sounds are normal. She exhibits no distension and no mass. There is no tenderness. There is no rigidity, no rebound, no guarding and no CVA tenderness.  Genitourinary: Rectal exam shows guaiac negative stool. No vaginal discharge found.  Musculoskeletal: She exhibits no edema.  Neurological: She is alert.  Skin: Skin is warm and dry. She is not diaphoretic. No pallor.    Results for orders placed or performed in visit on 09/15/16 (from the past 72 hour(s))  Hemoccult - 1 Card (office)     Status: Abnormal   Collection Time: 09/15/16  3:55 PM  Result Value Ref Range   Fecal Occult Blood, POC Positive (A) Negative   Card #1 Date 09/15/16    Card #2 Fecal Occult Blod, POC     Card #2 Date  Card #3 Fecal Occult Blood, POC     Card #3 Date      No results found.  ASSESSMENT AND PLAN:  Jamie Curtis was seen today for rectal bleeding.  Diagnoses and all orders for this visit:  Rectal bleeding: Given positive hemocult card will go ahead and start her on pantoprazole to cover for an ulcer since she will be out of town.  Fortunately she is well appearing and her abdominal exam is negative.  The other possibility is an internal hemorrhoid, however I would expect her bleeding to be less occult and more consistent BRBPR in her description.   -     H. pylori  breath test -     CBC -     Hemoccult - 1 Card (office) -     Cancel: GI Profile, Stool, PCR -     pantoprazole (PROTONIX) 40 MG tablet; Take daily 30 minutes before breakfast. Do not miss doses. -     GI Profile, Stool, PCR    The patient is advised to call or return to clinic if she does not see an improvement in symptoms, or to seek the care of the closest emergency department if she worsens with the above plan.   Jamie Curtis, MHS, PA-C [facility] at [facility] 09/15/2016 4:01 PM

## 2016-09-16 LAB — CBC
HEMATOCRIT: 41.5 % (ref 34.0–46.6)
Hemoglobin: 12.9 g/dL (ref 11.1–15.9)
MCH: 25.9 pg — ABNORMAL LOW (ref 26.6–33.0)
MCHC: 31.1 g/dL — AB (ref 31.5–35.7)
MCV: 83 fL (ref 79–97)
Platelets: 284 10*3/uL (ref 150–379)
RBC: 4.99 x10E6/uL (ref 3.77–5.28)
RDW: 14.2 % (ref 12.3–15.4)
WBC: 7 10*3/uL (ref 3.4–10.8)

## 2016-09-18 LAB — H. PYLORI BREATH TEST: H pylori Breath Test: NEGATIVE

## 2016-09-19 LAB — GI PROFILE, STOOL, PCR
ADENOVIRUS F 40/41: NOT DETECTED
ASTROVIRUS: NOT DETECTED
C difficile toxin A/B: NOT DETECTED
CRYPTOSPORIDIUM: NOT DETECTED
CYCLOSPORA CAYETANENSIS: NOT DETECTED
Campylobacter: NOT DETECTED
ENTEROAGGREGATIVE E COLI: NOT DETECTED
ENTEROTOXIGENIC E COLI: NOT DETECTED
Entamoeba histolytica: NOT DETECTED
Enteropathogenic E coli: NOT DETECTED
GIARDIA LAMBLIA: NOT DETECTED
Norovirus GI/GII: NOT DETECTED
Plesiomonas shigelloides: NOT DETECTED
Rotavirus A: NOT DETECTED
SALMONELLA: NOT DETECTED
Sapovirus: NOT DETECTED
Shiga-toxin-producing E coli: NOT DETECTED
Shigella/Enteroinvasive E coli: NOT DETECTED
VIBRIO: NOT DETECTED
Vibrio cholerae: NOT DETECTED
YERSINIA ENTEROCOLITICA: NOT DETECTED

## 2017-09-16 ENCOUNTER — Emergency Department (HOSPITAL_COMMUNITY)
Admission: EM | Admit: 2017-09-16 | Discharge: 2017-09-16 | Disposition: A | Discharge status: Home / Self Care | Payer: BC Managed Care – PPO | Attending: Emergency Medicine | Admitting: Emergency Medicine

## 2017-09-16 ENCOUNTER — Emergency Department (HOSPITAL_COMMUNITY): Payer: BC Managed Care – PPO

## 2017-09-16 ENCOUNTER — Encounter (HOSPITAL_COMMUNITY): Payer: Self-pay | Admitting: Emergency Medicine

## 2017-09-16 DIAGNOSIS — Z79899 Other long term (current) drug therapy: Secondary | ICD-10-CM | POA: Insufficient documentation

## 2017-09-16 DIAGNOSIS — Z23 Encounter for immunization: Secondary | ICD-10-CM | POA: Insufficient documentation

## 2017-09-16 DIAGNOSIS — S51811A Laceration without foreign body of right forearm, initial encounter: Secondary | ICD-10-CM | POA: Insufficient documentation

## 2017-09-16 DIAGNOSIS — Y939 Activity, unspecified: Secondary | ICD-10-CM | POA: Insufficient documentation

## 2017-09-16 DIAGNOSIS — S51011A Laceration without foreign body of right elbow, initial encounter: Secondary | ICD-10-CM | POA: Diagnosis not present

## 2017-09-16 DIAGNOSIS — S0502XA Injury of conjunctiva and corneal abrasion without foreign body, left eye, initial encounter: Secondary | ICD-10-CM | POA: Insufficient documentation

## 2017-09-16 DIAGNOSIS — S91312A Laceration without foreign body, left foot, initial encounter: Secondary | ICD-10-CM | POA: Diagnosis not present

## 2017-09-16 DIAGNOSIS — W25XXXA Contact with sharp glass, initial encounter: Secondary | ICD-10-CM | POA: Diagnosis not present

## 2017-09-16 DIAGNOSIS — S51812A Laceration without foreign body of left forearm, initial encounter: Secondary | ICD-10-CM | POA: Diagnosis not present

## 2017-09-16 DIAGNOSIS — Y929 Unspecified place or not applicable: Secondary | ICD-10-CM | POA: Diagnosis not present

## 2017-09-16 DIAGNOSIS — T07XXXA Unspecified multiple injuries, initial encounter: Secondary | ICD-10-CM

## 2017-09-16 DIAGNOSIS — R52 Pain, unspecified: Secondary | ICD-10-CM

## 2017-09-16 DIAGNOSIS — Y999 Unspecified external cause status: Secondary | ICD-10-CM | POA: Diagnosis not present

## 2017-09-16 MED ORDER — TETANUS-DIPHTH-ACELL PERTUSSIS 5-2.5-18.5 LF-MCG/0.5 IM SUSP
0.5000 mL | Freq: Once | INTRAMUSCULAR | Status: AC
  Administered 2017-09-16: 0.5 mL via INTRAMUSCULAR
  Filled 2017-09-16: qty 0.5

## 2017-09-16 MED ORDER — FLUORESCEIN SODIUM 1 MG OP STRP
1.0000 | ORAL_STRIP | Freq: Once | OPHTHALMIC | Status: AC
  Administered 2017-09-16: 1 via OPHTHALMIC
  Filled 2017-09-16: qty 1

## 2017-09-16 MED ORDER — SULFACETAMIDE SODIUM 10 % OP SOLN
2.0000 [drp] | Freq: Once | OPHTHALMIC | Status: AC
  Administered 2017-09-16: 2 [drp] via OPHTHALMIC
  Filled 2017-09-16: qty 15

## 2017-09-16 MED ORDER — LIDOCAINE-EPINEPHRINE (PF) 2 %-1:200000 IJ SOLN
20.0000 mL | Freq: Once | INTRAMUSCULAR | Status: AC
  Administered 2017-09-16: 20 mL
  Filled 2017-09-16: qty 20

## 2017-09-16 MED ORDER — CEPHALEXIN 500 MG PO CAPS: 500.0000 mg | ORAL_CAPSULE | Freq: Four times a day (QID) | ORAL | 0 refills | Status: DC

## 2017-09-16 MED ORDER — SULFACETAMIDE SODIUM 10 % OP SOLN: 2.0000 [drp] | Freq: Four times a day (QID) | OPHTHALMIC | 0 refills | Status: AC

## 2017-09-16 MED ORDER — TETRACAINE HCL 0.5 % OP SOLN
2.0000 [drp] | Freq: Once | OPHTHALMIC | Status: AC
  Administered 2017-09-16: 2 [drp] via OPHTHALMIC
  Filled 2017-09-16: qty 4

## 2017-09-16 NOTE — ED Notes (Signed)
Pt verbalizes understanding of d/c instructions. Pt received prescriptions. Pt ambulatory at d/c with all belongings and with family.   

## 2017-09-16 NOTE — ED Notes (Signed)
Pt has dried blood on arms, unable to assess cuts.  Warm/wet towels placed on Pt arms and foot.

## 2017-09-16 NOTE — ED Provider Notes (Signed)
[facility] EMERGENCY DEPARTMENT Provider Note   CSN: [id number] Arrival date & time: 09/16/17  1735     History   Chief Complaint Chief Complaint  Patient presents with  . Laceration    HPI Jamie Curtis is a 32 y.o. female with no significant past medical history who presents today for evaluation of multiple lacerations.  She was trying to push open a glass door when the glass broke.  She reports multiple cuts to her bilateral arms, and left foot.  She also reports feeling like she has glass in both of her eyes and in her mouth.  She denies any bleeding from her mouth.  She denies any vision changes.  Unsure when last tetanus shot was, chart review shows that it was more than 5 years ago. HPI  Past Medical History:  Diagnosis Date  . Allergy   . VV (varicose veins)     Patient Active Problem List   Diagnosis Date Noted  . Varicose veins of left lower extremity with complications 10/05/2014  . Varicose veins of lower extremities with other complications 10/01/2013    Past Surgical History:  Procedure Laterality Date  . VASCULAR SURGERY       OB History   None      Home Medications    Prior to Admission medications   Medication Sig Start Date End Date Taking? Authorizing Provider  cephALEXin (KEFLEX) 500 MG capsule Take 1 capsule (500 mg total) by mouth 4 (four) times daily. 09/16/17   Cristina Gong, PA-C  fluticasone [facility]) 50 MCG/ACT nasal spray Place 2 sprays into both nostrils daily. Reported on 07/28/2015    [provider]  loratadine (CLARITIN) 10 MG tablet Take 10 mg by mouth daily as needed for allergies.    [provider]  pantoprazole (PROTONIX) 40 MG tablet Take daily 30 minutes before breakfast. Do not miss doses. 09/15/16   Ofilia Neas, PA-C  sulfacetamide (BLEPH-10) 10 % ophthalmic solution Place 2 drops into both eyes 4 (four) times daily for 4 days. 09/16/17 09/20/17  Cristina Gong, PA-C    Vitamin D, Ergocalciferol, (DRISDOL) 50000 units CAPS capsule Take 50,000 Units by mouth every 7 (seven) days.    [provider]    Family History Family History  Problem Relation Age of Onset  . Hypertension Mother   . Cancer Father        melignant melanoma  . Diabetes Father   . Hypertension Father   . Diabetes Maternal Grandmother   . Heart disease Paternal Grandmother   . Deep vein thrombosis Sister   . Diabetes Brother   . Stroke Maternal Grandfather     Social History Social History   Tobacco Use  . Smoking status: Never Smoker  . Smokeless tobacco: Never Used  Substance Use Topics  . Alcohol use: No  . Drug use: No     Allergies   Pollen extract   Review of Systems Review of Systems  Eyes: Positive for pain.  Skin: Positive for wound.  Neurological: Negative for headaches.  All other systems reviewed and are negative.    Physical Exam Updated Vital Signs BP 121/89 (BP Location: Left Arm)   Pulse 86   Temp 98.3 F (36.8 C) (Oral)   Resp 18   Ht 5\' 8"  (1.727 m)   Wt 101.6 kg (224 lb)   LMP 08/18/2017 (Exact Date) Comment: shielded / pt. says periods are irregular  SpO2 98%   BMI  34.06 kg/m   Physical Exam  Constitutional: She is oriented to person, place, and time. She appears well-developed and well-nourished. No distress.  HENT:  Head: Normocephalic and atraumatic.  Eyes: Pupils are equal, round, and reactive to light. Conjunctivae, EOM and lids are normal. Lids are everted and swept, no foreign bodies found. Right eye exhibits no discharge. Left eye exhibits no discharge. No scleral icterus.  Slit lamp exam:      The right eye shows no corneal ulcer, no foreign body and no fluorescein uptake.       The left eye shows fluorescein uptake. The left eye shows no corneal ulcer and no foreign body.    Negative Seidel sign.  Neck: Normal range of motion.  Cardiovascular: Normal rate and regular rhythm.  Pulmonary/Chest: Effort  normal. No stridor. No respiratory distress.  Abdominal: She exhibits no distension.  Musculoskeletal: She exhibits no edema or deformity.  Right upper extremity has full pain-free ROM.  5/5 strength in right upper extremity.  Neurological: She is alert and oriented to person, place, and time. She exhibits normal muscle tone.  Sensation intact to right arm.  5/5 grip strength in right hand.  Skin: Skin is warm and dry. She is not diaphoretic.  Patient has many superficial lacerations under 1 cm across right and left arm, and left foot.  There is a laceration present on the right elbow.  3 lacerations, 2 of which are deep on right elbow.  Deepest laceration extends into subcutaneous tissue.  Psychiatric: She has a normal mood and affect. Her behavior is normal.  Nursing note and vitals reviewed.    ED Treatments / Results  Labs (all labs ordered are listed, but only abnormal results are displayed) Labs Reviewed - No data to display  EKG None  Radiology Dg Elbow Complete Right (3+view)  Result Date: 09/16/2017 CLINICAL DATA:  Cut with glass EXAM: RIGHT ELBOW - COMPLETE 3+ VIEW COMPARISON:  None. FINDINGS: Frontal, lateral common bile oblique views were obtained. There is soft tissue air consistent with laceration. No radiopaque foreign body evident. No fracture or dislocation. No joint effusion. Joint spaces appear normal. No erosive change. IMPRESSION: Soft tissue air consistent with laceration posterior and somewhat laterally. No radiopaque foreign body. No fracture or dislocation. No appreciable arthropathy. Electronically Signed   By: Bretta Bang III M.D.   On: 09/16/2017 21:30    Procedures .Marland KitchenLaceration Repair Performed by: Cristina Gong, PA-C Authorized by: Cristina Gong, PA-C   Consent:    Consent obtained:  Verbal   Consent given by:  Patient   Risks discussed:  Infection, need for additional repair, pain, retained foreign body, nerve damage, vascular  damage, tendon damage and poor cosmetic result (Discussed that it is a near certainty that she will have at least one piece of retained glass)   Alternatives discussed:  No treatment and referral Anesthesia (see MAR for exact dosages):    Anesthesia method:  Local infiltration   Local anesthetic:  Lidocaine 2% WITH epi Laceration details:    Location:  Shoulder/arm   Shoulder/arm location:  R elbow   Wound length (cm): Two parallel lacerations, each measuring 3cm.  Repair type:    Repair type:  Intermediate Pre-procedure details:    Preparation:  Patient was prepped and draped in usual sterile fashion and imaging obtained to evaluate for foreign bodies Exploration:    Hemostasis achieved with:  Epinephrine and direct pressure   Wound exploration: wound explored through full range of motion  and entire depth of wound probed and visualized     Wound extent: foreign bodies/material     Wound extent: no muscle damage noted, no nerve damage noted, no tendon damage noted, no underlying fracture noted and no vascular damage noted     Foreign bodies/material:  Glass   Contaminated: yes   Treatment:    Area cleansed with:  Saline (Cleansed with peroxide to help remove embedded glass.)   Amount of cleaning:  Extensive   Irrigation solution:  Sterile saline   Irrigation volume:  1 liter   Irrigation method:  Syringe   Visualized foreign bodies/material removed: yes   Subcutaneous repair:    Suture size:  4-0   Wound subcutaneous closure material used: Monocryl.   Suture technique:  Horizontal mattress   Number of sutures:  1 Skin repair:    Repair method:  Sutures   Suture size:  5-0   Suture material:  Prolene   Suture technique:  Simple interrupted and horizontal mattress   Number of sutures:  12 Approximation:    Approximation:  Close Post-procedure details:    Dressing:  Antibiotic ointment and non-adherent dressing   Patient tolerance of procedure:  Tolerated well, no immediate  complications Irrigation Performed by: Cristina Gong, PA-C Authorized by: Cristina Gong, PA-C  Consent: Verbal consent obtained. Risks and benefits: risks, benefits and alternatives were discussed Consent given by: patient Local anesthesia used: no  Anesthesia: Local anesthesia used: no  Sedation: Patient sedated: no  Patient tolerance: Patient tolerated the procedure well with no immediate complications Comments: Irrigation of bilateral eyes    (including critical care time)  Medications Ordered in ED Medications  fluorescein ophthalmic strip 1 strip (1 strip Both Eyes Given 09/16/17 1932)  lidocaine-EPINEPHrine (XYLOCAINE W/EPI) 2 %-1:200000 (PF) injection 20 mL (20 mLs Infiltration Given 09/16/17 1932)  tetracaine (PONTOCAINE) 0.5 % ophthalmic solution 2 drop (2 drops Both Eyes Given 09/16/17 1932)  Tdap (BOOSTRIX) injection 0.5 mL (0.5 mLs Intramuscular Given 09/16/17 2248)  sulfacetamide (BLEPH-10) 10 % ophthalmic solution 2 drop (2 drops Both Eyes Given 09/16/17 2316)     Initial Impression / Assessment and Plan / ED Course  I have reviewed the triage vital signs and the nursing notes.  Pertinent labs & imaging results that were available during my care of the patient were reviewed by me and considered in my medical decision making (see chart for details).    Pressure irrigation performed. Wound explored and base of wound visualized in a bloodless field without evidence of foreign body.  Laceration occurred < 8 hours prior to repair which was well tolerated.  Tdap updated.  Pt has  no comorbidities to effect normal wound healing. Pt discharged with antibiotics.  Prior to wound closure x-ray of elbow was performed which showed superficial soft tissue gas without evidence of intra-articular gas.  Close examination by both myself and Dr. Rush Landmark do not show evidence of joint involvement.  Discussed suture home care with patient and answered questions. Pt to follow-up  for wound check and suture removal in 14 days; they are to return to the ED sooner for signs of infection.  Patient also has evidence of left eye fluorescein uptake consistent with corneal abrasion.  Bilateral eyes were irrigated.  Patient given ophthalmology follow-up as needed.  Pt is hemodynamically stable with no complaints prior to dc.    Final Clinical Impressions(s) / ED Diagnoses   Final diagnoses:  Laceration of multiple sites of skin  Abrasion of  left cornea, initial encounter    ED Discharge Orders        Ordered    sulfacetamide (BLEPH-10) 10 % ophthalmic solution  4 times daily     09/16/17 2238    cephALEXin (KEFLEX) 500 MG capsule  4 times daily     09/16/17 2238       Cristina GongHammond, Shondrika Hoque W, [place] JerseyPA-C 09/16/17 2356    Tegeler, Canary Brimhristopher J, MD 09/17/17 [phone]

## 2017-09-16 NOTE — ED Notes (Signed)
Eyes irrigated with L of NS
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
## 2017-09-16 NOTE — Discharge Instructions (Signed)

## 2018-01-03 ENCOUNTER — Ambulatory Visit: Payer: BC Managed Care – PPO | Admitting: Cardiology

## 2018-04-21 ENCOUNTER — Encounter (HOSPITAL_COMMUNITY): Payer: Self-pay

## 2018-04-21 ENCOUNTER — Ambulatory Visit (HOSPITAL_COMMUNITY)
Admission: EM | Admit: 2018-04-21 | Discharge: 2018-04-21 | Disposition: A | Discharge status: Home / Self Care | Payer: BC Managed Care – PPO | Attending: Internal Medicine | Admitting: Internal Medicine

## 2018-04-21 DIAGNOSIS — B349 Viral infection, unspecified: Secondary | ICD-10-CM | POA: Diagnosis not present

## 2018-04-21 MED ORDER — BENZONATATE 100 MG PO CAPS: 100.0000 mg | ORAL_CAPSULE | Freq: Three times a day (TID) | ORAL | 0 refills | Status: DC

## 2018-04-21 MED ORDER — ONDANSETRON 4 MG PO TBDP: 4.0000 mg | ORAL_TABLET | Freq: Three times a day (TID) | ORAL | 0 refills | Status: DC | PRN

## 2018-04-21 NOTE — ED Triage Notes (Signed)
Pt presents with non productive cough, chills, generalized body aches, nausea, and sore throat.

## 2018-04-21 NOTE — ED Provider Notes (Signed)
[facility]    CSN: [id number] Arrival date & time: 04/21/18  1001     History   Chief Complaint Chief Complaint  Patient presents with  . URI    HPI Jamie Curtis is a 33 y.o. female with no past medical history comes to the urgent care department with complaints of cough, generalized body aches, sore throat and nausea.  Symptoms started a couple of days ago it is gotten progressively worse.  She denies any sputum production.  No fever or chills.  No aggravating or relieving factors.  No dizziness, near syncope or syncopal episodes.  HPI  Past Medical History:  Diagnosis Date  . Allergy   . VV (varicose veins)     Patient Active Problem List   Diagnosis Date Noted  . Varicose veins of left lower extremity with complications 10/05/2014  . Varicose veins of lower extremities with other complications 10/01/2013    Past Surgical History:  Procedure Laterality Date  . VASCULAR SURGERY      OB History   No obstetric history on file.      Home Medications    Prior to Admission medications   Medication Sig Start Date End Date Taking? Authorizing Provider  benzonatate (TESSALON) 100 MG capsule Take 1 capsule (100 mg total) by mouth every 8 (eight) hours. 04/21/18   Merrilee Jansky, MD  fluticasone (FLONASE) 50 MCG/ACT nasal spray Place 2 sprays into both nostrils daily. Reported on 07/28/2015    [provider]  loratadine (CLARITIN) 10 MG tablet Take 10 mg by mouth daily as needed for allergies.    [provider]  ondansetron (ZOFRAN ODT) 4 MG disintegrating tablet Take 1 tablet (4 mg total) by mouth every 8 (eight) hours as needed for nausea or vomiting. 04/21/18   Lamptey, Britta Mccreedy, MD  pantoprazole (PROTONIX) 40 MG tablet Take daily 30 minutes before breakfast. Do not miss doses. 09/15/16   Ofilia Neas, PA-C  Vitamin D, Ergocalciferol, (DRISDOL) 50000 units CAPS capsule Take 50,000 Units by mouth every 7 (seven) days.    [provider]    Family History Family History  Problem Relation Age of Onset  . Hypertension Mother   . Cancer Father        melignant melanoma  . Diabetes Father   . Hypertension Father   . Diabetes Maternal Grandmother   . Heart disease Paternal Grandmother   . Deep vein thrombosis Sister   . Diabetes Brother   . Stroke Maternal Grandfather     Social History Social History   Tobacco Use  . Smoking status: Never Smoker  . Smokeless tobacco: Never Used  Substance Use Topics  . Alcohol use: No  . Drug use: No     Allergies   Pollen extract   Review of Systems Review of Systems  Constitutional: Negative for activity change and appetite change.  HENT: Negative for congestion, ear discharge and ear pain.   Eyes: Negative for discharge and itching.  Respiratory: Positive for cough. Negative for chest tightness, shortness of breath and wheezing.   Gastrointestinal: Negative for abdominal distention and abdominal pain.  Endocrine: Negative for polydipsia.  Genitourinary: Negative for dysuria and urgency.  Musculoskeletal: Negative for arthralgias and back pain.  Allergic/Immunologic: Negative for food allergies.  Neurological: Negative for dizziness, syncope and light-headedness.  Psychiatric/Behavioral: Negative for agitation.     Physical Exam Triage Vital Signs ED Triage Vitals [04/21/18 1022]  Enc Vitals Group  BP (!) 118/96     Pulse Rate (!) 110     Resp 20     Temp 99.1 F (37.3 C)     Temp Source Oral     SpO2 97 %     Weight      Height      Head Circumference      Peak Flow      Pain Score 6     Pain Loc      Pain Edu?      Excl. in GC?    No data found.  Updated Vital Signs BP (!) 118/96 (BP Location: Left Arm)   Pulse (!) 110   Temp 99.1 F (37.3 C) (Oral)   Resp 20   LMP 04/03/2018   SpO2 97%   Visual Acuity Right Eye Distance:   Left Eye Distance:   Bilateral Distance:    Right Eye Near:   Left Eye Near:    Bilateral  Near:     Physical Exam Constitutional:      Appearance: Normal appearance. She is not ill-appearing.  HENT:     Right Ear: Tympanic membrane normal.     Left Ear: Tympanic membrane normal.     Nose: Nose normal.     Mouth/Throat:     Mouth: Mucous membranes are moist.     Pharynx: Posterior oropharyngeal erythema present. No oropharyngeal exudate.  Eyes:     Conjunctiva/sclera: Conjunctivae normal.  Neck:     Musculoskeletal: Normal range of motion. No neck rigidity.  Cardiovascular:     Rate and Rhythm: Normal rate and regular rhythm.     Pulses: Normal pulses.     Heart sounds: Normal heart sounds.  Pulmonary:     Effort: Pulmonary effort is normal.     Breath sounds: Normal breath sounds.  Abdominal:     General: Bowel sounds are normal.     Palpations: Abdomen is soft.  Lymphadenopathy:     Cervical: No cervical adenopathy.  Neurological:     Mental Status: She is alert.      UC Treatments / Results  Labs (all labs ordered are listed, but only abnormal results are displayed) Labs Reviewed - No data to display  EKG None  Radiology No results found.  Procedures Procedures (including critical care time)  Medications Ordered in UC Medications - No data to display  Initial Impression / Assessment and Plan / UC Course  I have reviewed the triage vital signs and the nursing notes.  Pertinent labs & imaging results that were available during my care of the patient were reviewed by me and considered in my medical decision making (see chart for details).     1.  Viral syndrome: Supportive care Zofran as needed for nausea Tessalon Perles as needed for cough  Final Clinical Impressions(s) / UC Diagnoses   Final diagnoses:  Viral syndrome   Discharge Instructions   None    ED Prescriptions    Medication Sig Dispense Auth. Provider   ondansetron (ZOFRAN ODT) 4 MG disintegrating tablet Take 1 tablet (4 mg total) by mouth every 8 (eight) hours as needed  for nausea or vomiting. 20 tablet Lamptey, Britta Mccreedy, MD   benzonatate (TESSALON) 100 MG capsule Take 1 capsule (100 mg total) by mouth every 8 (eight) hours. 21 capsule Lamptey, Britta Mccreedy, MD     Controlled Substance Prescriptions [place] Controlled Substance Registry consulted? No   Merrilee Jansky, MD 04/23/18 [phone]

## 2018-05-22 ENCOUNTER — Other Ambulatory Visit: Payer: Self-pay

## 2018-05-22 ENCOUNTER — Encounter (HOSPITAL_COMMUNITY): Payer: Self-pay | Admitting: Emergency Medicine

## 2018-05-22 ENCOUNTER — Ambulatory Visit (HOSPITAL_COMMUNITY)
Admission: EM | Admit: 2018-05-22 | Discharge: 2018-05-22 | Disposition: A | Discharge status: Home / Self Care | Payer: BC Managed Care – PPO | Attending: Family Medicine | Admitting: Family Medicine

## 2018-05-22 DIAGNOSIS — R200 Anesthesia of skin: Secondary | ICD-10-CM

## 2018-05-22 DIAGNOSIS — R9431 Abnormal electrocardiogram [ECG] [EKG]: Secondary | ICD-10-CM

## 2018-05-22 DIAGNOSIS — R079 Chest pain, unspecified: Secondary | ICD-10-CM | POA: Diagnosis not present

## 2018-05-22 MED ORDER — NAPROXEN 375 MG PO TABS: 375.0000 mg | ORAL_TABLET | Freq: Two times a day (BID) | ORAL | 0 refills | Status: DC

## 2018-05-22 NOTE — Discharge Instructions (Signed)
Offered patient further management and evaluation in the ED for chest pain, nausea, loose stools, and bilateral numbness of upper and lower extremities.  Also reviewed abnormal EKG with patient.  Patient declines further work up at this time.  Would like to try outpatient therapy first and watch and wait to see how her symptoms progress over the next 24-48 hours.  Rest, ice, heat, and gentle stretches Take naproxen as needed for pain relief (may cause abdominal discomfort, ulcers, and GI bleeds avoid taking with other NSAIDs) Follow up with PCP if symptoms persist Present to ER if worsening or new symptoms (fever, chills, headache, vision changes, shortness of breath, chest pain, nausea, vomiting, abdominal pain, changes in bowel or bladder habits, etc...)

## 2018-05-22 NOTE — ED Triage Notes (Signed)
PT woke up this morning with generalized "tingling" sensation and was nauseated. PT dry heaved and had two episodes of diarrhea. PT got an allergy shot Monday. PT has been using afrin and flonase.

## 2018-05-22 NOTE — ED Triage Notes (Signed)
PT went to [facility] in early February.

## 2018-05-22 NOTE — ED Provider Notes (Signed)
[facility]ENTER   [id number] 05/22/18 Arrival Time: 1128   CC: CHEST discomfort  SUBJECTIVE:  Jamie Curtis is a 33 y.o. female who presents with complaint of abrupt chest discomfort that began 2 hours ago.  Denies a precipitating event, trauma, recent lower respiratory tract, or strenuous upper body activities.  Does admit to lifting resistance bands, but denies specific event.  Localizes chest discomfort to the right side.  Describes as stable, that is intermittent and last for 5 seconds, and nagging in character.  Rates pain as 7/10.   Has tried tylenol without relief.  Denies aggravating or alleviating factors.  Denies radiating symptoms.  Reports previous symptoms with muscle pain in the past and had negative EKGs.  Complains of lightheadedness, nausea, 2 episodes of loose stools, and numbness and tingling in arms and legs. Denies fever, chills, dizziness, palpitations, tachycardia, SOB, vomiting, abdominal pain, changes in bladder habits, diaphoresis, peripheral edema, or anxiety.    Denies SOB, calf pain or swelling, recent long travel, recent surgery, pregnancy, malignancy, tobacco use, hormone use.  Does admit to superficial blood clot LLE, had vascular surgery.  Denies close relatives with cardiac hx paternal GM MI, and maternal stroke.    ROS: As per HPI. Past Medical History:  Diagnosis Date  . Allergy   . VV (varicose veins)    Past Surgical History:  Procedure Laterality Date  . VASCULAR SURGERY     Allergies  Allergen Reactions  . Pollen Extract    No current facility-administered medications on file prior to encounter.    Current Outpatient Medications on File Prior to Encounter  Medication Sig Dispense Refill  . pantoprazole (PROTONIX) 40 MG tablet Take daily 30 minutes before breakfast. Do not miss doses. 30 tablet 3  . Vitamin D, Ergocalciferol, (DRISDOL) 50000 units CAPS capsule Take 50,000 Units by mouth every 7 (seven) days.     Social History    Socioeconomic History  . Marital status: Unknown    Spouse name: Not on file  . Number of children: Not on file  . Years of education: Not on file  . Highest education level: Not on file  Occupational History  . Not on file  Social Needs  . Financial resource strain: Not on file  . Food insecurity:    Worry: Not on file    Inability: Not on file  . Transportation needs:    Medical: Not on file    Non-medical: Not on file  Tobacco Use  . Smoking status: Never Smoker  . Smokeless tobacco: Never Used  Substance and Sexual Activity  . Alcohol use: No  . Drug use: No  . Sexual activity: Yes    Birth control/protection: None  Lifestyle  . Physical activity:    Days per week: Not on file    Minutes per session: Not on file  . Stress: Not on file  Relationships  . Social connections:    Talks on phone: Not on file    Gets together: Not on file    Attends religious service: Not on file    Active member of club or organization: Not on file    Attends meetings of clubs or organizations: Not on file    Relationship status: Not on file  . Intimate partner violence:    Fear of current or ex partner: Not on file    Emotionally abused: Not on file    Physically abused: Not on file    Forced sexual activity: Not on  file  Other Topics Concern  . Not on file  Social History Narrative  . Not on file   Family History  Problem Relation Age of Onset  . Hypertension Mother   . Cancer Father        melignant melanoma  . Diabetes Father   . Hypertension Father   . Diabetes Maternal Grandmother   . Heart disease Paternal Grandmother   . Deep vein thrombosis Sister   . Diabetes Brother   . Stroke Maternal Grandfather      OBJECTIVE:  Vitals:   05/22/18 1148  BP: 108/84  Pulse: 82  Resp: 16  Temp: 97.8 F (36.6 C)  TempSrc: Oral  SpO2: 99%    General appearance: alert; no distress Eyes: PERRLA; EOMI; conjunctiva normal HENT: normocephalic; atraumatic Neck: supple  Lungs: clear to auscultation bilaterally without adventitious breath sounds Heart: regular rate and rhythm.  Clear S1 and S2 without rubs, gallops, or murmur. No carotid bruits Chest Wall: TTP over RT upper chest Abdomen: soft, non-tender; bowel sounds normal; no guarding Extremities: no cyanosis or edema; no calf tenderness; symmetrical with no gross deformities Neuro: CN 2-12 grossly intact; strength and sensation intact about the upper and lower extremity; finger to nose without difficulty; negative pronator drift Skin: warm and dry Psychological: alert and cooperative; normal mood and affect  ECG: Orders placed or performed during the hospital encounter of 05/22/18  . ED EKG  . ED EKG   EKG showed NSR, but inverted t-waves in lead III, V1-4. No prior EKG to compare. Reviewed with Dr. Tracie Harrier  ASSESSMENT & PLAN:  1. Chest pain, unspecified type   2. Numbness   3. Nonspecific abnormal electrocardiogram (ECG) (EKG)    Discussed patient case with Dr. Tracie Harrier.  Given PT HPI, PMH, and PE recommended watchful waiting with strict return and ED precautions.    Meds ordered this encounter  Medications  . naproxen (NAPROSYN) 375 MG tablet    Sig: Take 1 tablet (375 mg total) by mouth 2 (two) times daily.    Dispense:  20 tablet    Refill:  0    Order Specific Question:   Supervising Provider    Answer:   Eustace Moore [[id number]]   Offered patient further management and evaluation in the ED for chest pain, nausea, loose stools, and bilateral numbness of upper and lower extremities.  Also reviewed abnormal EKG with patient.  Patient declines further work up at this time.  Would like to try outpatient therapy first and watch and wait to see how her symptoms progress over the next 24-48 hours.  Rest, ice, heat, and gentle stretches Take naproxen as needed for pain relief (may cause abdominal discomfort, ulcers, and GI bleeds avoid taking with other NSAIDs) Follow up with PCP if symptoms  persist Present to ER if worsening or new symptoms (fever, chills, headache, vision changes, shortness of breath, chest pain, nausea, vomiting, abdominal pain, changes in bowel or bladder habits, etc...)   Chest pain precautions given. Reviewed expectations re: course of current medical issues. Questions answered. Outlined signs and symptoms indicating need for more acute intervention. Patient verbalized understanding. After Visit Summary given.  Greater than 40 minutes spent discussing patient's diagnosis and treatment options   Rennis Harding, PA-C 05/22/18 1419

## 2018-07-02 ENCOUNTER — Telehealth: Payer: Self-pay | Admitting: *Deleted

## 2018-07-02 NOTE — Telephone Encounter (Signed)
Returning [employer] telephone voice message regarding left leg pain.  Jamie Curtis is s/p endovenous laser ablation left greater saphenous vein by Dr. Josephina Gip on 10-05-2014.  Jamie Curtis states that the last 5 days she has experienced swelling and aching of lateral aspect of left calf.  Jamie Curtis states she has been "working out a lot" recently.  She states she has been wearing her compression hose and elevating her left leg when sitting.  She denies left foot/ankle swelling and denies skin discoloration or temperature changes in left leg.  Recommended she continue wearing compression hose, elevating left leg, limit exercise to walking,and  take Ibuprofen 600 mg with food TID prn leg pain.  Asked that she call [facility] if symptoms intensified or if she experienced left foot/ankle/lower calf swelling.  Jamie Curtis verbalized understanding.

## 2018-07-25 ENCOUNTER — Telehealth: Payer: Self-pay | Admitting: Internal Medicine

## 2018-07-25 NOTE — Telephone Encounter (Signed)
A new hem appt has been scheduled for the pt to see Dr. Melton Alar on 6/12 at 1050am. Pt has ben cld and made aware to arrive 20 minutes early.

## 2018-08-09 ENCOUNTER — Inpatient Hospital Stay: Payer: BC Managed Care – PPO

## 2018-08-09 ENCOUNTER — Other Ambulatory Visit: Payer: Self-pay

## 2018-08-09 ENCOUNTER — Encounter: Payer: Self-pay | Admitting: Internal Medicine

## 2018-08-09 ENCOUNTER — Inpatient Hospital Stay: Payer: BC Managed Care – PPO | Attending: Internal Medicine | Admitting: Internal Medicine

## 2018-08-09 VITALS — BP 122/91 | HR 86 | Temp 98.3°F | Resp 18 | Ht 68.0 in | Wt 195.1 lb

## 2018-08-09 DIAGNOSIS — R760 Raised antibody titer: Secondary | ICD-10-CM

## 2018-08-09 DIAGNOSIS — Z7901 Long term (current) use of anticoagulants: Secondary | ICD-10-CM | POA: Diagnosis not present

## 2018-08-09 DIAGNOSIS — Z86718 Personal history of other venous thrombosis and embolism: Secondary | ICD-10-CM | POA: Diagnosis present

## 2018-08-09 DIAGNOSIS — R1084 Generalized abdominal pain: Secondary | ICD-10-CM | POA: Diagnosis not present

## 2018-08-09 DIAGNOSIS — I82412 Acute embolism and thrombosis of left femoral vein: Secondary | ICD-10-CM

## 2018-08-09 DIAGNOSIS — R634 Abnormal weight loss: Secondary | ICD-10-CM

## 2018-08-09 DIAGNOSIS — D6862 Lupus anticoagulant syndrome: Secondary | ICD-10-CM | POA: Insufficient documentation

## 2018-08-09 DIAGNOSIS — M2550 Pain in unspecified joint: Secondary | ICD-10-CM

## 2018-08-09 DIAGNOSIS — I8390 Asymptomatic varicose veins of unspecified lower extremity: Secondary | ICD-10-CM

## 2018-08-09 LAB — CMP (CANCER CENTER ONLY)
ALT: 15 U/L (ref 0–44)
AST: 15 U/L (ref 15–41)
Albumin: 3.6 g/dL (ref 3.5–5.0)
Alkaline Phosphatase: 58 U/L (ref 38–126)
Anion gap: 10 (ref 5–15)
BUN: 6 mg/dL (ref 6–20)
CO2: 24 mmol/L (ref 22–32)
Calcium: 8.9 mg/dL (ref 8.9–10.3)
Chloride: 107 mmol/L (ref 98–111)
Creatinine: 0.8 mg/dL (ref 0.44–1.00)
GFR, Est AFR Am: >60 mL/min (ref >60)
GFR, Estimated: >60 mL/min (ref >60)
Glucose, Bld: 96 mg/dL (ref 70–99)
Potassium: 3.7 mmol/L (ref 3.5–5.1)
Sodium: 141 mmol/L (ref 135–145)
Total Bilirubin: 0.4 mg/dL (ref 0.3–1.2)
Total Protein: 7.3 g/dL (ref 6.5–8.1)

## 2018-08-09 LAB — CBC WITH DIFFERENTIAL (CANCER CENTER ONLY)
Abs Immature Granulocytes: 0 10*3/uL (ref 0.00–0.07)
Basophils Absolute: 0 10*3/uL (ref 0.0–0.1)
Basophils Relative: 1 %
Eosinophils Absolute: 0.1 10*3/uL (ref 0.0–0.5)
Eosinophils Relative: 1 %
HCT: 40.5 % (ref 36.0–46.0)
Hemoglobin: 12.6 g/dL (ref 12.0–15.0)
Immature Granulocytes: 0 %
Lymphocytes Relative: 40 %
Lymphs Abs: 2.3 10*3/uL (ref 0.7–4.0)
MCH: 27.2 pg (ref 26.0–34.0)
MCHC: 31.1 g/dL (ref 30.0–36.0)
MCV: 87.5 fL (ref 80.0–100.0)
Monocytes Absolute: 0.4 10*3/uL (ref 0.1–1.0)
Monocytes Relative: 7 %
Neutro Abs: 3 10*3/uL (ref 1.7–7.7)
Neutrophils Relative %: 51 %
Platelet Count: 300 10*3/uL (ref 150–400)
RBC: 4.63 MIL/uL (ref 3.87–5.11)
RDW: 14 % (ref 11.5–15.5)
WBC Count: 5.8 10*3/uL (ref 4.0–10.5)
nRBC: 0 % (ref 0.0–0.2)

## 2018-08-09 LAB — LACTATE DEHYDROGENASE: LDH: 135 U/L (ref 98–192)

## 2018-08-09 LAB — SEDIMENTATION RATE: Sed Rate: 24 mm/hr — ABNORMAL HIGH (ref 0–22)

## 2018-08-09 NOTE — Progress Notes (Signed)
Referring Physician:  Dr. Georgann HousekeeperKarrar Husain  Diagnosis Generalized abdominal pain - Plan: CBC with Differential (Cancer Center Only), CMP (Cancer Center only), Lactate dehydrogenase (LDH), Factor 5 Leiden*, Prothrombin gene mutation*, Beta-2-glycoprotein i abs, IgG/M/A, ANA, IFA (with reflex), Rheumatoid factor, Sedimentation rate, DG Chest 2 View  Abnormal weight loss - Plan: CBC with Differential (Cancer Center Only), CMP (Cancer Center only), Lactate dehydrogenase (LDH), Factor 5 Leiden*, Prothrombin gene mutation*, Beta-2-glycoprotein i abs, IgG/M/A, ANA, IFA (with reflex), Rheumatoid factor, Sedimentation rate, CT Abdomen Pelvis W Contrast, DG Chest 2 View  Acute deep vein thrombosis (DVT) of femoral vein of left lower extremity (HCC) - Plan: CBC with Differential (Cancer Center Only), CMP (Cancer Center only), Lactate dehydrogenase (LDH), Factor 5 Leiden*, Prothrombin gene mutation*, Beta-2-glycoprotein i abs, IgG/M/A, ANA, IFA (with reflex), Rheumatoid factor, Sedimentation rate, DG Chest 2 View  Staging Cancer Staging No matching staging information was found for the patient.  Assessment and Plan:  1.  Left LE DVT.  33 year old female referred for evaluation due to DVT.  Pt had doppler done 09/08/2014 that showed  Impression  - No evidence of deep vein thrombosis involving the left lower   extremity. - Superficial thrombus noted in the greater saphenous vein coursing   from approximately 3 inches above the ankle to just below the   knee. Also noted was a varicose thrombus at the level of the   &quot;knot&quot; and pain in the medial mid calf. - No evidence of Baker&'s cyst on the left.  She had left greater saphenous ablation done 10/05/2014.    Pt reports 4 weeks ago she noted pain in left LE.  She contacted vein specialist and subsequently had [facility]15[facility]2018 that showed Impression:  Study is positive for acute DVT involving the mid femoral vein extending  into the posterior tibial and peroneal veins in the calf.  Pt has started Eliquis.  Pt had labs done 07/17/2018 that showed findings that are consistent with a lupus anticoagulant.    Pt denies recent travel, surgery, hormonal therapy.  She reports leg improved since beginning Eliquis.  She reports some abdominal discomfort.  Pt is seen today for consultation due to Left LE DVT.    Discussed with pt possible causes of DVT which include extensive travel, surgery, hormonal therapy, malignancy.  Pt recommended for 6 months of anticoagulation with repeat doppler Evaluation for resolution of thrombosis prior to recommending discontinuation of therapy.  Pt will have hypercoagulable evaluation done today. She will be set up for CT abdomen and pelvis  and CXR to evaluate for any evidence of occult malignancy.  Pt will have phone visit follow-up in 2 weeks to go over results.    2.  Positive lupus anticoagulant.  Pt had labs done 07/17/2018 that showed findings that are consistent with a lupus anticoagulant.  I discussed with her labs are nonspecific as she is on Eliquis  Which may affect results.  Pt will have repeat labs once therapy completed.    3.  Abdominal discomfort.  Pt is set up for CT abdomen and pelvis for further evaluation due to recent DVT history.    4.  Joint pain.  Awaiting results of RF, ANA, sed rate.    5.  Varicosities.  Pt has undergone ablation in the past.    40 minutes spent with more than 50% spent in review of records, counseling and coordination of care.    HPI:  33 year old female  referred for evaluation due to DVT.  Pt had doppler done 09/08/2014 that showed Impression  - No evidence of deep vein thrombosis involving the left lower   extremity. - Superficial thrombus noted in the greater saphenous vein coursing   from approximately 3 inches above the ankle to just below the   knee. Also noted was a varicose thrombus at the level of the   &quot;knot&quot; and pain in the  medial mid calf. - No evidence of Baker&'s cyst on the left.  She had left greater saphenous ablation done 10/05/2014.    Pt reports 4 weeks ago she noted pain in left LE.  She contacted vein specialist and subsequently had [facility] that showed Impression:  Study is positive for acute DVT involving the mid femoral vein extending into the posterior tibial and peroneal veins in the calf.  Pt has started Eliquis.  Pt had labs done 07/17/2018 that showed findings that are consistent with a lupus anticoagulant.    Pt denies recent travel, surgery, hormonal therapy.  She reports leg improved since beginning Eliquis.  She reports some abdominal discomfort.  Pt is seen today for consultation  due to Left LE DVT.    Problem List Patient Active Problem List   Diagnosis Date Noted  . 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 Varicose veins of lower extremities with other complications [I83.893] 10/01/2013    Past Medical History Past Medical History:  Diagnosis Date  . Allergy   . VV (varicose veins)     Past Surgical History Past Surgical History:  Procedure Laterality Date  . VASCULAR SURGERY      Family History Family History  Problem Relation Age of Onset  . Hypertension Mother   . Cancer Father        melignant melanoma  . Diabetes Father   . Hypertension Father   . Diabetes Maternal Grandmother   . Heart disease Paternal Grandmother   . Deep vein thrombosis Sister   . Diabetes Brother   . Stroke Maternal Grandfather      Social History  reports that she has never smoked. She has never used smokeless tobacco. She reports that she does not drink alcohol or use drugs.  Medications  Current Outpatient Medications:  .  naproxen (NAPROSYN) 375 MG tablet, Take 1 tablet (375 mg total) by mouth 2 (two) times daily., Disp: 20 tablet, Rfl: 0 .  pantoprazole (PROTONIX) 40 MG tablet, Take daily 30 minutes  before breakfast. Do not miss doses., Disp: 30 tablet, Rfl: 3 .  Vitamin D, Ergocalciferol, (DRISDOL) 50000 units CAPS capsule, Take 50,000 Units by mouth every 7 (seven) days., Disp: , Rfl:   Allergies Pollen extract  Review of Systems Review of Systems - Oncology ROS negative other than joint pain.     Physical Exam  Vitals Wt Readings from Last 3 Encounters:  08/09/18 195 lb 1.6 oz (88.5 kg)  09/16/17 224 lb (101.6 kg)  09/15/16 224 lb 12.8 oz (102 kg)   Temp Readings from Last 3 Encounters:  08/09/18 98.3 F (36.8 C) (Oral)  05/22/18 97.8 F (36.6 C) (Oral)  04/21/18 99.1 F (37.3 C) (Oral)   BP Readings from Last 3 Encounters:  08/09/18 (!) 122/91  05/22/18 108/84  04/21/18 (!) 118/96   Pulse Readings from Last 3 Encounters:  08/09/18 86  05/22/18 82  04/21/18 (!) 110   Constitutional: Well-developed, well-nourished, and in no distress.  HENT: Head: Normocephalic and atraumatic.  Mouth/Throat: No oropharyngeal exudate. Mucosa moist. Eyes: Pupils are equal, round, and reactive to light. Conjunctivae are normal. No scleral icterus.  Neck: Normal range of motion. Neck supple. No JVD present.  Cardiovascular: Normal rate, regular rhythm and normal heart sounds.  Exam reveals no gallop and no friction rub.   No murmur heard. Pulmonary/Chest: Effort normal and breath sounds normal. No respiratory distress. No wheezes.No rales.  Abdominal: Soft. Bowel sounds are normal. No distension. There is no tenderness. There is no guarding.  Musculoskeletal: No edema or tenderness. Varicosities.  Pt has on compression hose.   Lymphadenopathy: No cervical,axillary or supraclavicular adenopathy.  Neurological: Alert and oriented to person, place, and time. No cranial nerve deficit.  Skin: Skin is warm and dry. No rash noted. No erythema. No pallor.  Psychiatric: Affect and judgment normal.   Labs Appointment on 08/09/2018  Component Date Value Ref Range Status  . Sed Rate  08/09/2018 24* 0 - 22 mm/hr Final   Performed at [facility], [place] [address] [place], [place] [address]  . LDH 08/09/2018 135  98 - 192 U/L Final   Performed at [facility], [place] [address], [place], [place] [address]  . Sodium 08/09/2018 141  135 - 145 mmol/L Final  . Potassium 08/09/2018 3.7  3.5 - 5.1 mmol/L Final  . Chloride 08/09/2018 107  98 - 111 mmol/L Final  . CO2 08/09/2018 24  22 - 32 mmol/L Final  . Glucose, Bld 08/09/2018 96  70 - 99 mg/dL Final  . BUN 08/09/2018 6  6 - 20 mg/dL Final  . Creatinine 08/09/2018 0.80  0.44 - 1.00 mg/dL Final  . Calcium 08/09/2018 8.9  8.9 - 10.3 mg/dL Final  . Total Protein 08/09/2018 7.3  6.5 - 8.1 g/dL Final  . Albumin 08/09/2018 3.6  3.5 - 5.0 g/dL Final  . AST 08/09/2018 15  15 - 41 U/L Final  . ALT 08/09/2018 15  0 - 44 U/L Final  . Alkaline Phosphatase 08/09/2018 58  38 - 126 U/L Final  . Total Bilirubin 08/09/2018 0.4  0.3 - 1.2 mg/dL Final  . GFR, Est Non Af Am 08/09/2018 >60  >60 mL/min Final  . GFR, Est AFR Am 08/09/2018 >60  >60 mL/min Final  . Anion gap 08/09/2018 10  5 - 15 Final   Performed at [facility], [place] [address], [place], [place] [address]  . WBC Count 08/09/2018 5.8  4.0 - 10.5 K/uL Final  . RBC 08/09/2018 4.63  3.87 - 5.11 MIL/uL Final  . Hemoglobin 08/09/2018 12.6  12.0 - 15.0 g/dL Final  . HCT 08/09/2018 40.5  36.0 - 46.0 % Final  . MCV 08/09/2018 87.5  80.0 - 100.0 fL Final  . MCH 08/09/2018 27.2  26.0 - 34.0 pg Final  . MCHC 08/09/2018 31.1  30.0 - 36.0 g/dL Final  . RDW 08/09/2018 14.0  11.5 - 15.5 % Final  . Platelet Count 08/09/2018 300  150 - 400 K/uL Final  . nRBC 08/09/2018 0.0  0.0 - 0.2 % Final  . Neutrophils Relative % 08/09/2018 51  % Final  . Neutro Abs 08/09/2018 3.0  1.7 - 7.7 K/uL Final  . Lymphocytes Relative 08/09/2018 40  % Final  . Lymphs Abs 08/09/2018 2.3  0.7 - 4.0 K/uL Final  . Monocytes Relative 08/09/2018 7  %  Final  . Monocytes Absolute 08/09/2018 0.4  0.1 - 1.0 K/uL  Final  . Eosinophils Relative 08/09/2018 1  % Final  . Eosinophils Absolute 08/09/2018 0.1  0.0 - 0.5 K/uL Final  . Basophils Relative 08/09/2018 1  % Final  . Basophils Absolute 08/09/2018 0.0  0.0 - 0.1 K/uL Final  . Immature Granulocytes 08/09/2018 0  % Final  . Abs Immature Granulocytes 08/09/2018 0.00  0.00 - 0.07 K/uL Final   Performed at [facility], [address], [place], [place] [address]     Pathology Orders Placed This Encounter  Procedures  . CT Abdomen Pelvis W Contrast    Standing Status:   Future    Standing Expiration Date:   08/09/2019    Order Specific Question:   If indicated for the ordered procedure, I authorize the administration of contrast media per Radiology protocol    Answer:   Yes    Order Specific Question:   Is patient pregnant?    Answer:   No    Order Specific Question:   Preferred imaging location?    Answer:   [facility]    Order Specific Question:   Is Oral Contrast requested for this exam?    Answer:   Yes, Per Radiology protocol    Order Specific Question:   Radiology Contrast Protocol - do NOT remove file path    Answer:   \\[email or web address]  . DG Chest 2 View    Standing Status:   Future    Number of Occurrences:   1    Standing Expiration Date:   08/09/2019    Order Specific Question:   Reason for Exam (SYMPTOM  OR DIAGNOSIS REQUIRED)    Answer:   shortness of breath.  DVT    Order Specific Question:   Is patient pregnant?    Answer:   No    Order Specific Question:   Preferred imaging location?    Answer:   [facility]    Order Specific Question:   Radiology Contrast Protocol - do NOT remove file path    Answer:   \\[email or web address]s.pdf  . CBC with Differential (Cancer Center Only)    Standing Status:   Future    Number of Occurrences:   1    Standing Expiration Date:    08/09/2019  . CMP (Cancer Center only)    Standing Status:   Future    Number of Occurrences:   1    Standing Expiration Date:   08/09/2019  . Lactate dehydrogenase (LDH)    Standing Status:   Future    Number of Occurrences:   1    Standing Expiration Date:   08/09/2019  . Factor 5 Leiden*    Standing Status:   Future    Number of Occurrences:   1    Standing Expiration Date:   08/09/2019  . Prothrombin gene mutation*    Standing Status:   Future    Number of Occurrences:   1    Standing Expiration Date:   08/09/2019  . Beta-2-glycoprotein i abs, IgG/M/A    Standing Status:   Future    Number of Occurrences:   1    Standing Expiration Date:   08/09/2019  . ANA, IFA (with reflex)    Standing Status:   Future    Number of Occurrences:   1    Standing Expiration Date:   08/09/2019  . Rheumatoid factor    Standing Status:   Future    Number of Occurrences:   1  Standing Expiration Date:   08/09/2019  . Sedimentation rate    Standing Status:   Future    Number of Occurrences:   1    Standing Expiration Date:   08/09/2019       Ahmed PrimaVetta Lorali Khamis MD

## 2018-08-10 LAB — RHEUMATOID FACTOR: Rhuematoid fact SerPl-aCnc: <10 IU/mL (ref 0.0–13.9)

## 2018-08-11 LAB — BETA-2-GLYCOPROTEIN I ABS, IGG/M/A
Beta-2 Glyco I IgG: <9 GPI IgG units (ref 0–20)
Beta-2-Glycoprotein I IgA: <9 GPI IgA units (ref 0–25)
Beta-2-Glycoprotein I IgM: <9 GPI IgM units (ref 0–32)

## 2018-08-12 LAB — ANTINUCLEAR ANTIBODIES, IFA: ANA Ab, IFA: NEGATIVE

## 2018-08-13 ENCOUNTER — Telehealth: Payer: Self-pay | Admitting: Internal Medicine

## 2018-08-13 NOTE — Telephone Encounter (Signed)
I talk with patient regarding schedule  

## 2018-08-15 LAB — FACTOR 5 LEIDEN

## 2018-08-15 LAB — PROTHROMBIN GENE MUTATION

## 2018-08-16 ENCOUNTER — Other Ambulatory Visit: Payer: Self-pay

## 2018-08-16 ENCOUNTER — Ambulatory Visit (HOSPITAL_COMMUNITY)
Admission: RE | Admit: 2018-08-16 | Discharge: 2018-08-16 | Disposition: A | Discharge status: Home / Self Care | Payer: BC Managed Care – PPO | Source: Ambulatory Visit | Attending: Internal Medicine | Admitting: Internal Medicine

## 2018-08-16 DIAGNOSIS — I82412 Acute embolism and thrombosis of left femoral vein: Secondary | ICD-10-CM | POA: Diagnosis present

## 2018-08-16 DIAGNOSIS — R634 Abnormal weight loss: Secondary | ICD-10-CM | POA: Insufficient documentation

## 2018-08-16 DIAGNOSIS — R1084 Generalized abdominal pain: Secondary | ICD-10-CM | POA: Insufficient documentation

## 2018-08-16 MED ORDER — IOHEXOL 300 MG/ML  SOLN
100.0000 mL | Freq: Once | INTRAMUSCULAR | Status: AC | PRN
  Administered 2018-08-16: 100 mL via INTRAVENOUS

## 2018-08-16 MED ORDER — SODIUM CHLORIDE (PF) 0.9 % IJ SOLN
INTRAMUSCULAR | Status: AC
  Filled 2018-08-16: qty 50

## 2018-08-23 ENCOUNTER — Inpatient Hospital Stay: Payer: BC Managed Care – PPO | Admitting: Internal Medicine

## 2018-08-23 ENCOUNTER — Ambulatory Visit (HOSPITAL_BASED_OUTPATIENT_CLINIC_OR_DEPARTMENT_OTHER): Payer: BC Managed Care – PPO | Admitting: Internal Medicine

## 2018-08-23 DIAGNOSIS — Z7901 Long term (current) use of anticoagulants: Secondary | ICD-10-CM

## 2018-08-23 DIAGNOSIS — I82412 Acute embolism and thrombosis of left femoral vein: Secondary | ICD-10-CM | POA: Diagnosis not present

## 2018-08-23 DIAGNOSIS — D6862 Lupus anticoagulant syndrome: Secondary | ICD-10-CM | POA: Diagnosis not present

## 2018-08-23 MED ORDER — APIXABAN 5 MG PO TABS: 5.0000 mg | ORAL_TABLET | Freq: Two times a day (BID) | ORAL | 2 refills | Status: DC

## 2018-08-23 NOTE — Progress Notes (Signed)
Virtual Visit via Telephone Note  I connected with Jamie Curtis on 08/23/18 at 10:10 AM EDT by telephone and verified that I am speaking with the correct person using two identifiers.   I discussed the limitations, risks, security and privacy concerns of performing an evaluation and management service by telephone and the availability of in person appointments. I also discussed with the patient that there may be a patient responsible charge related to this service. The patient expressed understanding and agreed to proceed.  Interval History:  Historical data obtained from note dated 08/09/2018.  33 year old female referred for evaluation due to DVT.  Pt had doppler done 09/08/2014 that showed Impression  - No evidence of deep vein thrombosis involving the left lower   extremity. - Superficial thrombus noted in the greater saphenous vein coursing   from approximately 3 inches above the ankle to just below the   knee. Also noted was a varicose thrombus at the level of the   &quot;knot&quot; and pain in the medial mid calf. - No evidence of Baker&'s cyst on the left.  She had left greater saphenous ablation done 10/05/2014.    Pt reports 4 weeks ago she noted pain in left LE.  She contacted vein specialist and subsequently had a doppler done at [place] on 07/15/2018 that showed Impression:  Study is positive for acute DVT involving the mid femoral vein extending into the posterior tibial and peroneal veins in the calf.  Pt has started Eliquis.  Pt had labs done 07/17/2018 that showed findings consistent with a lupus anticoagulant.    Pt denies recent travel, surgery, hormonal therapy.  She reports leg improved since beginning Eliquis.  She reports some abdominal discomfort.     Observations/Objective: Review of labs and imaging.    Assessment and Plan:   1.  Left LE DVT.  33 year old female referred for evaluation due to DVT.  Pt had doppler done 09/08/2014 that showed  Impression  - No  evidence of deep vein thrombosis involving the left lower   extremity. - Superficial thrombus noted in the greater saphenous vein coursing   from approximately 3 inches above the ankle to just below the   knee. Also noted was a varicose thrombus at the level of the   &quot;knot&quot; and pain in the medial mid calf. - No evidence of Baker&'s cyst on the left.  She had left greater saphenous ablation done 10/05/2014.    Pt reports 4 weeks ago she noted pain in left LE.  She contacted vein specialist and subsequently had a doppler done at [place] on 07/15/2018 that showed Impression:  Study is positive for acute DVT involving the mid femoral vein extending into the posterior tibial and peroneal veins in the calf.  Pt has started Eliquis.  Pt had labs done 07/17/2018 that showed findings consistent with a lupus anticoagulant.    Pt denies recent travel, surgery, hormonal therapy.  She reports leg improved since beginning Eliquis.  She reports some abdominal discomfort.  Pt is seen today for consultation due to Left LE DVT.    Discussed with pt possible causes of DVT which include extensive travel, surgery, hormonal therapy, malignancy, sedentary lifestyle.    CT of abdomen and pelvis done 08/16/2018 reviewed and showed IMPRESSION: 1. Unremarkable CT scan of the abdomen and pelvis.    CXR done 08/16/2018 was negative.    Labs done 08/09/2018 reviewed and showed WBC 5.8 HB 12.6 plts 300,000.  Chemistries  showed K+ 3.7 Cr 0.8 normal LFTs.  B2 GP abs < 9.  Pt had negative factor V leiden and PT gene mutation.    Pt is recommended for 6 months of anticoagulation with repeat doppler evaluation to determine if there is resolution of thrombosis prior to recommending discontinuation of therapy.  Pt is set up for bilateral LE doppler in 12/2018.  She will be seen for follow-up in 12/2018 with labs.  Pt should continue Eliquis as recommended.  RX for Eliquis # 60 with refills sent to pharmacy.  Pt  advised to remain active and avoid sedentary lifestyle.   2.  Positive lupus anticoagulant.  Pt had labs done 07/17/2018 that showed findings consistent with a lupus anticoagulant.  I discussed with her labs are nonspecific as she is on Eliquis which may affect results.  Pt will have repeat labs once therapy completed.    3.  Abdominal discomfort.  CT abdomen and pelvis done 08/16/2018 was negative.  Follow-up with PCP if ongoing symptoms.    4.  Joint pain.  RF, ANA negative.  Sed rate 24.  Follow-up with PCP if ongoing symptoms.    5.  Varicosities.  Pt has undergone ablation in the past.    Follow Up Instructions:  Bilateral LE doppler in 12/2018.  Labs and follow-up to go over results in 12/2018.      I discussed the assessment and treatment plan with the patient. The patient was provided an opportunity to ask questions and all were answered. The patient agreed with the plan and demonstrated an understanding of the instructions.   The patient was advised to call back or seek an in-person evaluation if the symptoms worsen or if the condition fails to improve as anticipated.  I provided 15 minutes of non-face-to-face time during this encounter.   Ahmed PrimaVetta Mirel Hundal, MD

## 2018-08-28 ENCOUNTER — Other Ambulatory Visit: Payer: Self-pay | Admitting: Internal Medicine

## 2018-08-28 ENCOUNTER — Ambulatory Visit: Payer: BC Managed Care – PPO | Admitting: Internal Medicine

## 2018-08-28 NOTE — Progress Notes (Signed)
This encounter was created in error - please disregard.

## 2019-01-10 ENCOUNTER — Telehealth: Payer: Self-pay | Admitting: Hematology and Oncology

## 2019-01-10 NOTE — Telephone Encounter (Signed)
[facility] [place] to [place]. Confirmed 11/23 f/u with patient and also 11/19 doppler already on schedule.

## 2019-01-16 ENCOUNTER — Ambulatory Visit (HOSPITAL_COMMUNITY)
Admission: RE | Admit: 2019-01-16 | Discharge: 2019-01-16 | Disposition: A | Discharge status: Home / Self Care | Payer: BC Managed Care – PPO | Source: Ambulatory Visit | Attending: Internal Medicine | Admitting: Internal Medicine

## 2019-01-16 ENCOUNTER — Other Ambulatory Visit: Payer: Self-pay

## 2019-01-16 DIAGNOSIS — I82412 Acute embolism and thrombosis of left femoral vein: Secondary | ICD-10-CM | POA: Diagnosis present

## 2019-01-16 NOTE — Progress Notes (Signed)
VASCULAR LAB PRELIMINARY  PRELIMINARY  PRELIMINARY  PRELIMINARY  Bilateral lower extremity venous duplex completed.    Preliminary report:  See CV proc for preliminary results.  Left message on Dr. Geralyn Flash nurse's voicemail.  Cordarius Benning, RVT 01/16/2019, 11:12 AM

## 2019-01-19 NOTE — Progress Notes (Signed)
[facility] CONSULT NOTE  Patient Care Team: Georgann Housekeeper, MD as PCP - General (Internal Medicine)  CHIEF COMPLAINTS/PURPOSE OF CONSULTATION:  History of DVT on Eliquis   HISTORY OF PRESENTING ILLNESS:  Jamie Curtis 33 y.o. female is here because of a history of DVT. 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 In 06/2018 she had an acute DVT of the left mid femoral vein. She was started on Eliquis. Labs were positive for lupus anticoagulant. Lower extremity US on 01/16/19 showed no evidence of DVT, improved from prior study. She is a former patient of Dr. Melton Alar and is transferring to my care. She presents to the clinic today for evaluation and to discuss her recent US.   I reviewed her records extensively and collaborated the history with the patient.  MEDICAL HISTORY:  Past Medical History:  Diagnosis Date  . Allergy   . VV (varicose veins)     SURGICAL HISTORY: Past Surgical History:  Procedure Laterality Date  . VASCULAR SURGERY      SOCIAL HISTORY: Social History   Socioeconomic History  . Marital status: Single    Spouse name: Not on file  . Number of children: Not on file  . Years of education: Not on file  . Highest education level: Not on file  Occupational History  . Not on file  Social Needs  . Financial resource strain: Not on file  . Food insecurity    Worry: Not on file    Inability: Not on file  . Transportation needs    Medical: Not on file    Non-medical: Not on file  Tobacco Use  . Smoking status: Never Smoker  . Smokeless tobacco: Never Used  Substance and Sexual Activity  . Alcohol use: No  . Drug use: No  . Sexual activity: Yes    Birth control/protection: None  Lifestyle  . Physical activity    Days per week: Not on file    Minutes per session: Not on file  . Stress: Not on file  Relationships  . Social Musician on phone: Not on file    Gets together: Not on file    Attends  religious service: Not on file    Active member of club or organization: Not on file    Attends meetings of clubs or organizations: Not on file    Relationship status: Not on file  . Intimate partner violence    Fear of current or ex partner: Not on file    Emotionally abused: Not on file    Physically abused: Not on file    Forced sexual activity: Not on file  Other Topics Concern  . Not on file  Social History Narrative  . Not on file    FAMILY HISTORY: Family History  Problem Relation Age of Onset  . Hypertension Mother   . Cancer Father        melignant melanoma  . Diabetes Father   . Hypertension Father   . Diabetes Maternal Grandmother   . Heart disease Paternal Grandmother   . Deep vein thrombosis Sister   . Diabetes Brother   . Stroke Maternal Grandfather     ALLERGIES:  is allergic to pollen extract.  MEDICATIONS:  Current Outpatient Medications  Medication Sig Dispense Refill  . apixaban (ELIQUIS) 5 MG TABS tablet Take 1 tablet (5 mg total) by mouth 2 (two) times daily. 60 tablet 2  . naproxen (  NAPROSYN) 375 MG tablet Take 1 tablet (375 mg total) by mouth 2 (two) times daily. 20 tablet 0  . pantoprazole (PROTONIX) 40 MG tablet Take daily 30 minutes before breakfast. Do not miss doses. 30 tablet 3  . Vitamin D, Ergocalciferol, (DRISDOL) 50000 units CAPS capsule Take 50,000 Units by mouth every 7 (seven) days.     No current facility-administered medications for this visit.     REVIEW OF SYSTEMS:   Constitutional: Denies fevers, chills or abnormal night sweats Eyes: Denies blurriness of vision, double vision or watery eyes Ears, nose, mouth, throat, and face: Denies mucositis or sore throat Respiratory: Denies cough, dyspnea or wheezes Cardiovascular: Denies palpitation, chest discomfort or lower extremity swelling Gastrointestinal:  Denies nausea, heartburn or change in bowel habits Skin: Denies abnormal skin rashes Lymphatics: Denies new lymphadenopathy or  easy bruising Neurological:Denies numbness, tingling or new weaknesses Behavioral/Psych: Mood is stable, no new changes  Breast: Denies any palpable lumps or discharge All other systems were reviewed with the patient and are negative.  PHYSICAL EXAMINATION: ECOG PERFORMANCE STATUS: 1 - Symptomatic but completely ambulatory  Vitals:   01/20/19 0855  BP: 127/85  Pulse: 86  Resp: 20  Temp: 98.6 F (37 C)  SpO2: 100%   Filed Weights   01/20/19 0855  Weight: 195 lb 14.4 oz (88.9 kg)    GENERAL:alert, no distress and comfortable SKIN: skin color, texture, turgor are normal, no rashes or significant lesions EYES: normal, conjunctiva are pink and non-injected, sclera clear OROPHARYNX:no exudate, no erythema and lips, buccal mucosa, and tongue normal  NECK: supple, thyroid normal size, non-tender, without nodularity LYMPH:  no palpable lymphadenopathy in the cervical, axillary or inguinal LUNGS: clear to auscultation and percussion with normal breathing effort HEART: regular rate & rhythm and no murmurs and no lower extremity edema ABDOMEN:abdomen soft, non-tender and normal bowel sounds Musculoskeletal:no cyanosis of digits and no clubbing  PSYCH: alert & oriented x 3 with fluent speech NEURO: no focal motor/sensory deficits  LABORATORY DATA:  I have reviewed the data as listed Lab Results  Component Value Date   WBC 5.8 08/09/2018   HGB 12.6 08/09/2018   HCT 40.5 08/09/2018   MCV 87.5 08/09/2018   PLT 300 08/09/2018   Lab Results  Component Value Date   NA 141 08/09/2018   K 3.7 08/09/2018   CL 107 08/09/2018   CO2 24 08/09/2018    RADIOGRAPHIC STUDIES: I have personally reviewed the radiological reports and agreed with the findings in the report.  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 Left: Findings appear improved from previous  examination. There is no evidence of deep vein thrombosis in the lower extremity.  10/05/2014 left greater saphenous ablation 07/15/2018 : Study is positive for acute DVT involving the mid femoral vein extending into the posterior tibial and peroneal veins in the calf. 08/09/2018: CT Abd: Neg (for abd pain)  Treatment: Eliquis started May 2020 Hypercoagulability work-up: Positive for lupus anticoagulant with her primary care physician. Beta-2 glycoprotein 1 antibody: Negative However I discussed with the patient that this needs to be repeated to determine if she is persistently positive for the lupus anticoagulant.  If it is positive she will need blood thinners for life.  We discussed that she cannot get pregnant while taking Eliquis and we may have to switch her to Lovenox if she wants to have a baby.  She does not have any such plans at this time.  I will call her with the result of this test. We can then decide her follow-up plan.  All questions were answered. The patient knows to call the clinic with any problems, questions or concerns.   Sabas SousVinay K Hilding Quintanar, MD, MPH 01/20/2019    I, Molly Dorshimer, am acting as scribe for Serena CroissantVinay Latrise Bowland, MD.  I have reviewed the above documentation for accuracy and completeness, and I agree with the above.

## 2019-01-20 ENCOUNTER — Inpatient Hospital Stay: Payer: BC Managed Care – PPO

## 2019-01-20 ENCOUNTER — Other Ambulatory Visit: Payer: Self-pay

## 2019-01-20 ENCOUNTER — Inpatient Hospital Stay: Payer: BC Managed Care – PPO | Attending: Hematology and Oncology | Admitting: Hematology and Oncology

## 2019-01-20 DIAGNOSIS — I82412 Acute embolism and thrombosis of left femoral vein: Secondary | ICD-10-CM

## 2019-01-20 DIAGNOSIS — Z86718 Personal history of other venous thrombosis and embolism: Secondary | ICD-10-CM | POA: Diagnosis present

## 2019-01-20 DIAGNOSIS — I82402 Acute embolism and thrombosis of unspecified deep veins of left lower extremity: Secondary | ICD-10-CM | POA: Insufficient documentation

## 2019-01-20 DIAGNOSIS — I824Z2 Acute embolism and thrombosis of unspecified deep veins of left distal lower extremity: Secondary | ICD-10-CM

## 2019-01-20 DIAGNOSIS — Z7901 Long term (current) use of anticoagulants: Secondary | ICD-10-CM | POA: Insufficient documentation

## 2019-01-20 LAB — CBC WITH DIFFERENTIAL (CANCER CENTER ONLY)
Abs Immature Granulocytes: 0.02 10*3/uL (ref 0.00–0.07)
Basophils Absolute: 0 10*3/uL (ref 0.0–0.1)
Basophils Relative: 1 %
Eosinophils Absolute: 0 10*3/uL (ref 0.0–0.5)
Eosinophils Relative: 1 %
HCT: 40.7 % (ref 36.0–46.0)
Hemoglobin: 12.7 g/dL (ref 12.0–15.0)
Immature Granulocytes: 0 %
Lymphocytes Relative: 37 %
Lymphs Abs: 2.4 10*3/uL (ref 0.7–4.0)
MCH: 26.7 pg (ref 26.0–34.0)
MCHC: 31.2 g/dL (ref 30.0–36.0)
MCV: 85.7 fL (ref 80.0–100.0)
Monocytes Absolute: 0.4 10*3/uL (ref 0.1–1.0)
Monocytes Relative: 7 %
Neutro Abs: 3.6 10*3/uL (ref 1.7–7.7)
Neutrophils Relative %: 54 %
Platelet Count: 267 10*3/uL (ref 150–400)
RBC: 4.75 MIL/uL (ref 3.87–5.11)
RDW: 13.8 % (ref 11.5–15.5)
WBC Count: 6.4 10*3/uL (ref 4.0–10.5)
nRBC: 0 % (ref 0.0–0.2)

## 2019-01-20 LAB — CMP (CANCER CENTER ONLY)
ALT: 10 U/L (ref 0–44)
AST: 14 U/L — ABNORMAL LOW (ref 15–41)
Albumin: 3.9 g/dL (ref 3.5–5.0)
Alkaline Phosphatase: 53 U/L (ref 38–126)
Anion gap: 8 (ref 5–15)
BUN: 8 mg/dL (ref 6–20)
CO2: 23 mmol/L (ref 22–32)
Calcium: 8.7 mg/dL — ABNORMAL LOW (ref 8.9–10.3)
Chloride: 108 mmol/L (ref 98–111)
Creatinine: 0.82 mg/dL (ref 0.44–1.00)
GFR, Est AFR Am: >60 mL/min (ref >60)
GFR, Estimated: >60 mL/min (ref >60)
Glucose, Bld: 101 mg/dL — ABNORMAL HIGH (ref 70–99)
Potassium: 3.9 mmol/L (ref 3.5–5.1)
Sodium: 139 mmol/L (ref 135–145)
Total Bilirubin: 0.6 mg/dL (ref 0.3–1.2)
Total Protein: 7.4 g/dL (ref 6.5–8.1)

## 2019-01-20 LAB — LACTATE DEHYDROGENASE: LDH: 143 U/L (ref 98–192)

## 2019-01-20 NOTE — Assessment & Plan Note (Addendum)
01/16/19: Right: There is no evidence of deep vein thrombosis in the lower extremity. Left: Findings appear improved from previous examination. There is no evidence of deep vein thrombosis in the lower extremity.  10/05/2014 left greater saphenous ablation 07/15/2018 : Study is positive for acute DVT involving the mid femoral vein extending into the posterior tibial and peroneal veins in the calf. 08/09/2018: CT Abd: Neg (for abd pain)  Treatment: Eliquis X 6 months completed Nov 2020

## 2019-01-22 LAB — PTT-LA INCUB MIX: PTT-LA Incub Mix: 43.7 s (ref 0.0–48.9)

## 2019-01-22 LAB — LUPUS ANTICOAGULANT PANEL
DRVVT: 73.8 s — ABNORMAL HIGH (ref 0.0–47.0)
PTT Lupus Anticoagulant: 52.5 s — ABNORMAL HIGH (ref 0.0–51.9)

## 2019-01-22 LAB — PTT-LA MIX: PTT-LA Mix: 41.8 s (ref 0.0–48.9)

## 2019-01-22 LAB — DRVVT MIX: dRVVT Mix: 46.7 s (ref 0.0–47.0)

## 2019-02-03 ENCOUNTER — Telehealth: Payer: Self-pay | Admitting: Hematology and Oncology

## 2019-02-03 NOTE — Telephone Encounter (Signed)
I left a message for the patient that the lupus anticoagulant test was negative however it did show suggestion that there may be a factor deficiency which should not increase the risk of blood clots and therefore nothing further needs to be done. I instructed her to call me to discuss results.

## 2019-02-04 ENCOUNTER — Other Ambulatory Visit: Payer: Self-pay | Admitting: Hematology and Oncology

## 2019-02-04 DIAGNOSIS — I824Z2 Acute embolism and thrombosis of unspecified deep veins of left distal lower extremity: Secondary | ICD-10-CM

## 2019-02-05 ENCOUNTER — Telehealth: Payer: Self-pay | Admitting: Hematology and Oncology

## 2019-02-05 NOTE — Telephone Encounter (Signed)
Scheduled appt per 12/8 sch message - unable to reach pt . Left message with appt date and time   

## 2019-02-10 ENCOUNTER — Other Ambulatory Visit: Payer: Self-pay | Admitting: Obstetrics and Gynecology

## 2019-02-10 DIAGNOSIS — E049 Nontoxic goiter, unspecified: Secondary | ICD-10-CM

## 2019-02-17 ENCOUNTER — Other Ambulatory Visit: Payer: BC Managed Care – PPO

## 2019-02-19 ENCOUNTER — Ambulatory Visit
Admission: RE | Admit: 2019-02-19 | Discharge: 2019-02-19 | Disposition: A | Discharge status: Home / Self Care | Payer: BC Managed Care – PPO | Source: Ambulatory Visit | Attending: Obstetrics and Gynecology | Admitting: Obstetrics and Gynecology

## 2019-02-19 DIAGNOSIS — E049 Nontoxic goiter, unspecified: Secondary | ICD-10-CM

## 2019-03-04 ENCOUNTER — Other Ambulatory Visit: Payer: Self-pay

## 2019-03-04 ENCOUNTER — Ambulatory Visit (HOSPITAL_COMMUNITY)
Admission: RE | Admit: 2019-03-04 | Discharge: 2019-03-04 | Disposition: A | Discharge status: Home / Self Care | Payer: BC Managed Care – PPO | Source: Ambulatory Visit | Attending: Hematology and Oncology | Admitting: Hematology and Oncology

## 2019-03-04 ENCOUNTER — Encounter: Payer: Self-pay | Admitting: Hematology and Oncology

## 2019-03-04 ENCOUNTER — Telehealth: Payer: Self-pay

## 2019-03-04 DIAGNOSIS — I824Z2 Acute embolism and thrombosis of unspecified deep veins of left distal lower extremity: Secondary | ICD-10-CM

## 2019-03-04 NOTE — Progress Notes (Signed)
Lower extremity venous has been completed.   Preliminary results in CV Proc.   Attempted to call results, no answer.   Blanch Media 03/04/2019 4:47 PM

## 2019-03-04 NOTE — Telephone Encounter (Signed)
RN reviewed mychart message with MD.    MD recommendations for Left US Doppler to r/o DVT.  RN set up with doppler at [facility] for today @ 4pm.  RN notified patient.

## 2019-04-01 ENCOUNTER — Other Ambulatory Visit: Payer: Self-pay | Admitting: Internal Medicine

## 2019-04-01 DIAGNOSIS — E041 Nontoxic single thyroid nodule: Secondary | ICD-10-CM

## 2019-04-23 ENCOUNTER — Ambulatory Visit
Admission: RE | Admit: 2019-04-23 | Discharge: 2019-04-23 | Disposition: A | Discharge status: Home / Self Care | Payer: BC Managed Care – PPO | Source: Ambulatory Visit | Attending: Internal Medicine | Admitting: Internal Medicine

## 2019-04-23 ENCOUNTER — Other Ambulatory Visit (HOSPITAL_COMMUNITY)
Admission: RE | Admit: 2019-04-23 | Discharge: 2019-04-23 | Disposition: A | Discharge status: Home / Self Care | Payer: BC Managed Care – PPO | Source: Ambulatory Visit | Attending: Radiology | Admitting: Radiology

## 2019-04-23 DIAGNOSIS — E041 Nontoxic single thyroid nodule: Secondary | ICD-10-CM

## 2019-04-23 DIAGNOSIS — E042 Nontoxic multinodular goiter: Secondary | ICD-10-CM | POA: Diagnosis present

## 2019-04-24 LAB — CYTOLOGY - NON PAP

## 2019-05-06 ENCOUNTER — Inpatient Hospital Stay: Payer: BC Managed Care – PPO | Attending: Hematology and Oncology

## 2019-05-06 DIAGNOSIS — Z7901 Long term (current) use of anticoagulants: Secondary | ICD-10-CM | POA: Insufficient documentation

## 2019-05-06 DIAGNOSIS — D6862 Lupus anticoagulant syndrome: Secondary | ICD-10-CM | POA: Insufficient documentation

## 2019-05-06 DIAGNOSIS — Z86718 Personal history of other venous thrombosis and embolism: Secondary | ICD-10-CM | POA: Insufficient documentation

## 2019-05-12 NOTE — Progress Notes (Signed)
I called the patient twice but there was no response.  Her mobile phone does not have any capacity to accept voicemails.   This encounter was created in error - please disregard.

## 2019-05-13 ENCOUNTER — Inpatient Hospital Stay: Payer: BC Managed Care – PPO | Admitting: Hematology and Oncology

## 2019-05-13 DIAGNOSIS — D6862 Lupus anticoagulant syndrome: Secondary | ICD-10-CM | POA: Insufficient documentation

## 2019-05-13 DIAGNOSIS — I824Z2 Acute embolism and thrombosis of unspecified deep veins of left distal lower extremity: Secondary | ICD-10-CM

## 2019-05-13 NOTE — Assessment & Plan Note (Signed)
01/16/19: Right: There is no evidence of deep vein thrombosis in the lower extremity. Left: Findings appear improved from previous examination. There is no evidence of deep vein thrombosis in the lower extremity.  10/05/2014 left greater saphenous ablation 07/15/2018 :Study is positive for acute DVT involving the mid femoral vein extending into the posterior tibial and peroneal veins in the calf. 08/09/2018: CT Abd: Neg (for abd pain)  Treatment: Eliquis started May 2020 Hypercoagulability work-up: Positive for lupus anticoagulant with her primary care physician. Beta-2 glycoprotein 1 antibody: Negative  We discussed that she cannot get pregnant while taking Eliquis and we may have to switch her to Lovenox if she wants to have a baby.  She does not have any such plans at this time.

## 2019-05-16 ENCOUNTER — Encounter: Payer: Self-pay | Admitting: Hematology and Oncology

## 2019-05-16 ENCOUNTER — Telehealth: Payer: Self-pay | Admitting: Hematology and Oncology

## 2019-05-16 NOTE — Telephone Encounter (Signed)
Called pt per 3/19 sch message - unable to reach and unable to leave vmail

## 2019-05-19 ENCOUNTER — Telehealth: Payer: Self-pay | Admitting: Hematology and Oncology

## 2019-05-19 NOTE — Telephone Encounter (Signed)
Returned call from vmail - no answer . Left message for patient to call back to r/s 3/9 and 3/16 appt

## 2019-05-21 ENCOUNTER — Telehealth: Payer: Self-pay

## 2019-05-21 ENCOUNTER — Ambulatory Visit (HOSPITAL_COMMUNITY)
Admission: RE | Admit: 2019-05-21 | Discharge: 2019-05-21 | Disposition: A | Discharge status: Home / Self Care | Payer: BC Managed Care – PPO | Source: Ambulatory Visit | Attending: Hematology and Oncology | Admitting: Hematology and Oncology

## 2019-05-21 ENCOUNTER — Other Ambulatory Visit: Payer: Self-pay

## 2019-05-21 ENCOUNTER — Inpatient Hospital Stay: Payer: BC Managed Care – PPO

## 2019-05-21 ENCOUNTER — Encounter: Payer: Self-pay | Admitting: Hematology and Oncology

## 2019-05-21 DIAGNOSIS — Z7901 Long term (current) use of anticoagulants: Secondary | ICD-10-CM | POA: Diagnosis not present

## 2019-05-21 DIAGNOSIS — I824Z2 Acute embolism and thrombosis of unspecified deep veins of left distal lower extremity: Secondary | ICD-10-CM | POA: Diagnosis present

## 2019-05-21 DIAGNOSIS — Z86718 Personal history of other venous thrombosis and embolism: Secondary | ICD-10-CM | POA: Diagnosis not present

## 2019-05-21 DIAGNOSIS — D6862 Lupus anticoagulant syndrome: Secondary | ICD-10-CM | POA: Diagnosis not present

## 2019-05-21 DIAGNOSIS — M7989 Other specified soft tissue disorders: Secondary | ICD-10-CM | POA: Diagnosis present

## 2019-05-21 LAB — APTT: aPTT: 36 seconds (ref 24–36)

## 2019-05-21 LAB — PROTIME-INR
INR: 1 (ref 0.8–1.2)
Prothrombin Time: 13.5 seconds (ref 11.4–15.2)

## 2019-05-21 NOTE — Telephone Encounter (Signed)
Call report received that patient was negative for DVT.  MD notified, patient was given instructions to discharge.

## 2019-05-21 NOTE — Telephone Encounter (Signed)
RN reviewed mychart message with MD regarding leg/ankle swelling to LLE.  Pt denies any discoloration or warmth to the touch.  Reports pain to LLE.    MD recommendations for Vas [place] Lower Ext - pt scheduled for today @ 1pm.  Pt notified voiced understanding and agreement.

## 2019-05-21 NOTE — Progress Notes (Signed)
Left lower extremity venous duplex has been completed. Preliminary results can be found in CV Proc through chart review.  Results were given to Marisue Ivan at Dr. Earmon Phoenix office.  05/21/19 1:38 PM Olen Cordial RVT

## 2019-05-22 LAB — VON WILLEBRAND PANEL
Coagulation Factor VIII: 161 % — ABNORMAL HIGH (ref 56–140)
Ristocetin Co-factor, Plasma: 134 % (ref 50–200)
Von Willebrand Antigen, Plasma: 161 % (ref 50–200)

## 2019-05-22 LAB — LUPUS ANTICOAGULANT PANEL
DRVVT: 34.2 s (ref 0.0–47.0)
PTT Lupus Anticoagulant: 37.4 s (ref 0.0–51.9)

## 2019-05-22 LAB — COAG STUDIES INTERP REPORT

## 2019-05-22 NOTE — Progress Notes (Signed)
Patient Care Team: Georgann Housekeeper, MD as PCP - General (Internal Medicine)  DIAGNOSIS:    ICD-10-CM   1. 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 with above-mentioned history of DVT currently on anticoagulation with Eliquis. Patient reported LLE swelling, and [place] on 05/21/19 showed no evidence of DVT. She presents to the clinic today for follow-up.   ALLERGIES:  is allergic to pollen extract.  MEDICATIONS:  Current Outpatient Medications  Medication Sig Dispense Refill  . apixaban (ELIQUIS) 5 MG TABS tablet Take 1 tablet (5 mg total) by mouth 2 (two) times daily. 60 tablet 2  . naproxen (NAPROSYN) 375 MG tablet Take 1 tablet (375 mg total) by mouth 2 (two) times daily. 20 tablet 0  . pantoprazole (PROTONIX) 40 MG tablet Take daily 30 minutes before breakfast. Do not miss doses. 30 tablet 3  . Vitamin D, Ergocalciferol, (DRISDOL) 50000 units CAPS capsule Take 50,000 Units by mouth every 7 (seven) days.     No current facility-administered medications for this visit.    PHYSICAL EXAMINATION: ECOG PERFORMANCE STATUS: 1 - Symptomatic but completely ambulatory  Vitals:   05/23/19 0829  BP: 120/84  Pulse: (!) 117  Resp: 18  Temp: 98.3 F (36.8 C)  SpO2: 98%   Filed Weights   05/23/19 0829  Weight: 196 lb 9.6 oz (89.2 kg)    LABORATORY DATA:  I have reviewed the data as listed CMP Latest Ref Rng & Units 01/20/2019 08/09/2018  Glucose 70 - 99 mg/dL 638(G) 96  BUN 6 - 20 mg/dL 8 6  Creatinine 6.65 - 1.00 mg/dL 9.93 5.70  Sodium 177 - 145 mmol/L 139 141  Potassium 3.5 - 5.1 mmol/L 3.9 3.7  Chloride 98 - 111 mmol/L 108 107  CO2 22 - 32 mmol/L 23 24  Calcium 8.9 - 10.3 mg/dL 9.3(J) 8.9  Total Protein 6.5 - 8.1 g/dL 7.4 7.3  Total Bilirubin 0.3 - 1.2 mg/dL 0.6 0.4  Alkaline Phos 38 - 126 U/L 53 58  AST 15 - 41 U/L 14(L) 15  ALT 0 - 44 U/L 10 15    Lab Results  Component Value  Date   WBC 6.4 01/20/2019   HGB 12.7 01/20/2019   HCT 40.7 01/20/2019   MCV 85.7 01/20/2019   PLT 267 01/20/2019   NEUTROABS 3.6 01/20/2019    ASSESSMENT & PLAN:  Lupus anticoagulant syndrome (HCC) 01/16/19: Right: There is no evidence of deep vein thrombosis in the lower extremity. Left: Findings appear improved from previous examination. There is no evidence of deep vein thrombosis in the lower extremity.  10/05/2014 left greater saphenous ablation 07/15/2018 :Study is positive for acute DVT involving the mid femoral vein extending into the posterior tibial and peroneal veins in the calf. 08/09/2018: CT Abd: Neg (for abd pain)  Treatment: Eliquis started May 2020 Hypercoagulability work-up: Positive for lupus anticoagulant with her primary care physician. Beta-2 glycoprotein 1 antibody: Negative  05/21/2019: Lupus anticoagulant: Not detected Von Willebrand factor: Normal    Recent evaluation by lower extremity ultrasound: No DVT identified Return to clinic on an as-needed basis.  No orders of the defined types were placed in this encounter.  The patient has a good understanding of the overall plan. she agrees with it. she will call with any problems that may develop before the next visit here.  Total time spent: 20 mins including face to face time and  time spent for planning, charting and coordination of care  Nicholas Lose, MD 05/23/2019  I, Cloyde Reams Dorshimer, am acting as scribe for Dr. Nicholas Lose.  I have reviewed the above documentation for accuracy and completeness, and I agree with the above.

## 2019-05-23 ENCOUNTER — Inpatient Hospital Stay: Payer: BC Managed Care – PPO | Admitting: Hematology and Oncology

## 2019-05-23 ENCOUNTER — Other Ambulatory Visit: Payer: Self-pay

## 2019-05-23 DIAGNOSIS — D6862 Lupus anticoagulant syndrome: Secondary | ICD-10-CM | POA: Diagnosis not present

## 2019-05-23 MED ORDER — VITAMIN D 125 MCG (5000 UT) PO CAPS: 1.0000 | ORAL_CAPSULE | Freq: Every day | ORAL | Status: AC

## 2019-05-23 NOTE — Assessment & Plan Note (Signed)
01/16/19: Right: There is no evidence of deep vein thrombosis in the lower extremity. Left: Findings appear improved from previous examination. There is no evidence of deep vein thrombosis in the lower extremity.  10/05/2014 left greater saphenous ablation 07/15/2018 :Study is positive for acute DVT involving the mid femoral vein extending into the posterior tibial and peroneal veins in the calf. 08/09/2018: CT Abd: Neg (for abd pain)  Treatment: Eliquis started May 2020 Hypercoagulability work-up: Positive for lupus anticoagulant with her primary care physician. Beta-2 glycoprotein 1 antibody: Negative  05/21/2019: Lupus anticoagulant: Not detected Von Willebrand factor: Normal  I discussed with the patient to continue anticoagulation for 1 year and then discontinue it. Recent evaluation by lower extremity ultrasound: No DVT identified Return to clinic on an as-needed basis.

## 2020-03-29 ENCOUNTER — Other Ambulatory Visit: Payer: Self-pay | Admitting: Obstetrics and Gynecology

## 2020-03-29 DIAGNOSIS — E042 Nontoxic multinodular goiter: Secondary | ICD-10-CM

## 2020-04-23 ENCOUNTER — Ambulatory Visit
Admission: RE | Admit: 2020-04-23 | Discharge: 2020-04-23 | Disposition: A | Discharge status: Home / Self Care | Payer: BC Managed Care – PPO | Source: Ambulatory Visit | Attending: Obstetrics and Gynecology | Admitting: Obstetrics and Gynecology

## 2020-04-23 DIAGNOSIS — E042 Nontoxic multinodular goiter: Secondary | ICD-10-CM

## 2021-04-01 ENCOUNTER — Other Ambulatory Visit: Payer: Self-pay | Admitting: Obstetrics and Gynecology

## 2021-04-01 DIAGNOSIS — E049 Nontoxic goiter, unspecified: Secondary | ICD-10-CM

## 2021-04-20 ENCOUNTER — Ambulatory Visit
Admission: RE | Admit: 2021-04-20 | Discharge: 2021-04-20 | Disposition: A | Discharge status: Home / Self Care | Payer: Managed Care, Other (non HMO) | Source: Ambulatory Visit | Attending: Obstetrics and Gynecology | Admitting: Obstetrics and Gynecology

## 2021-04-20 DIAGNOSIS — E049 Nontoxic goiter, unspecified: Secondary | ICD-10-CM

## 2023-02-21 IMAGING — US US THYROID
1 series · 13 of 25 positions shown · non-contrast
Comparison: 02/19/2019

CLINICAL DATA: Goiter . Previous FNA biopsy of superior left and
mid left nodules 04/23/2019.

EXAM:
THYROID ULTRASOUND
TECHNIQUE: Ultrasound examination of the thyroid gland and adjacent soft
tissues was performed.

[Series 1: us thyroid · 0.04mm/px · 13 of 87 slices shown]
[im 1/87]
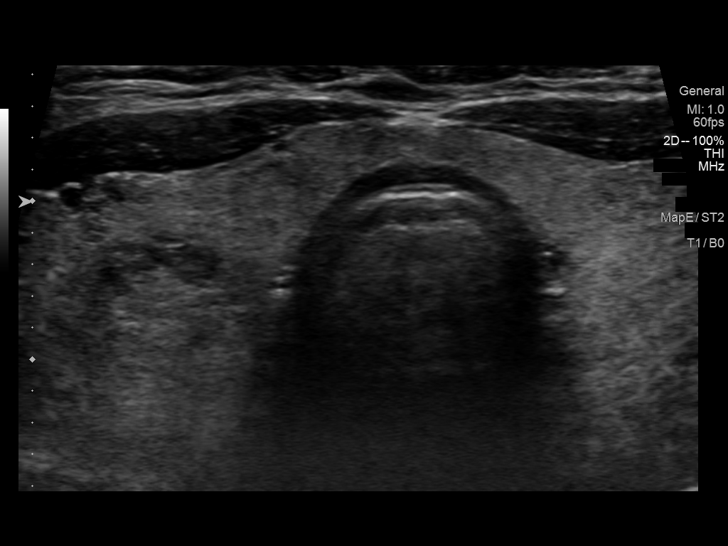
[im 8/87]
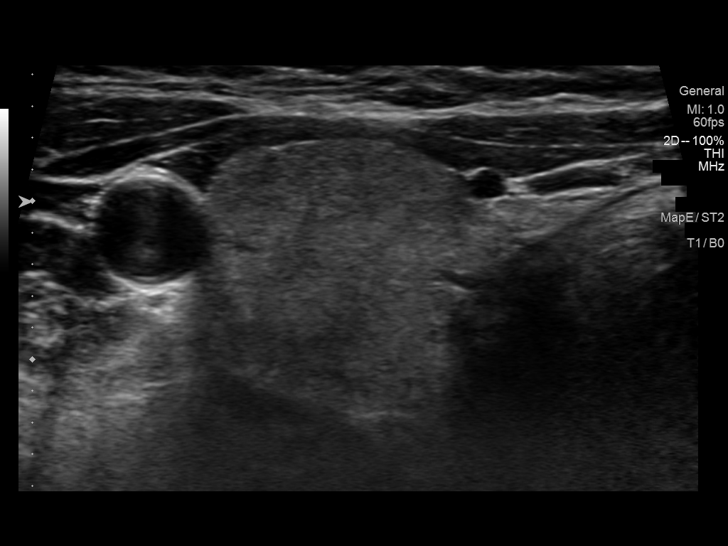
[im 15/87]
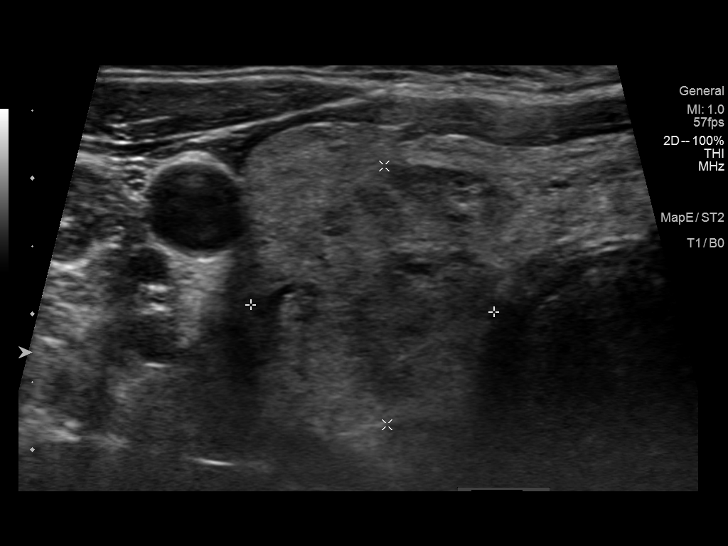
[im 22/87]
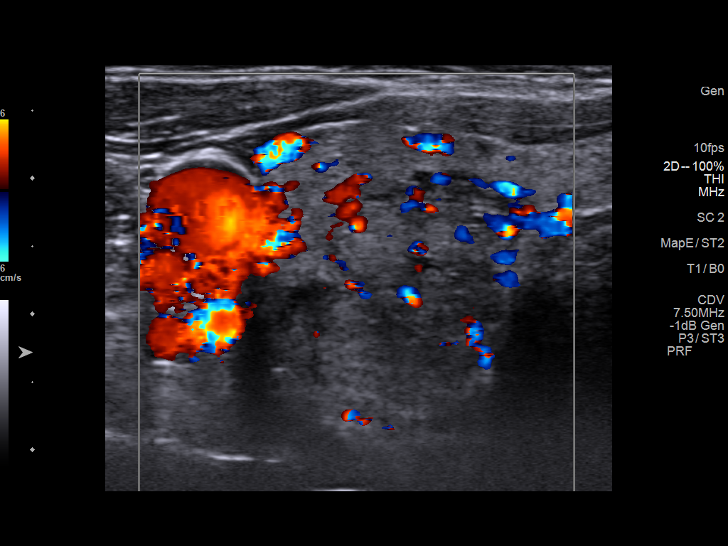
[im 29/87]
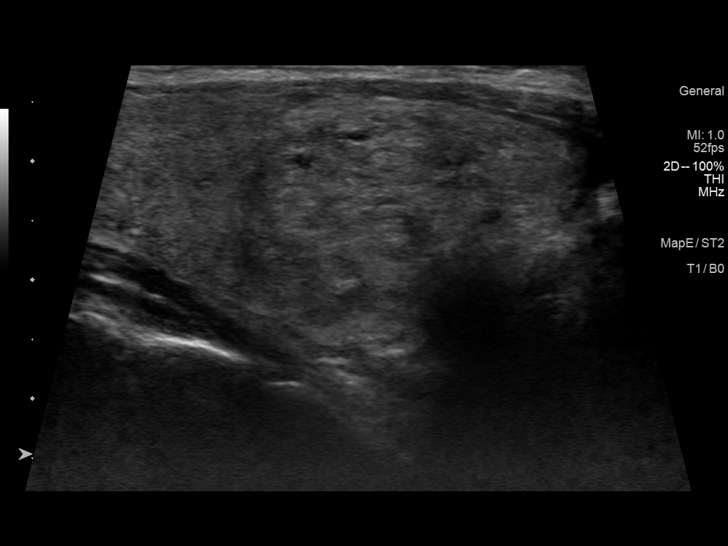
[im 36/87]
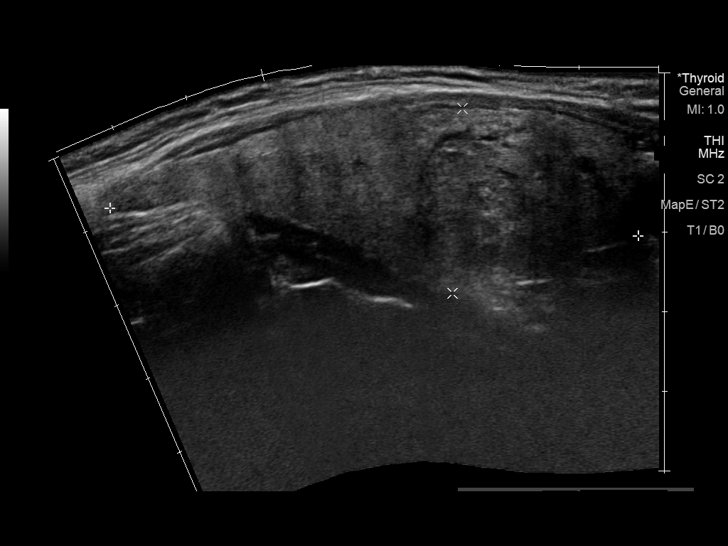
[im 44/87]
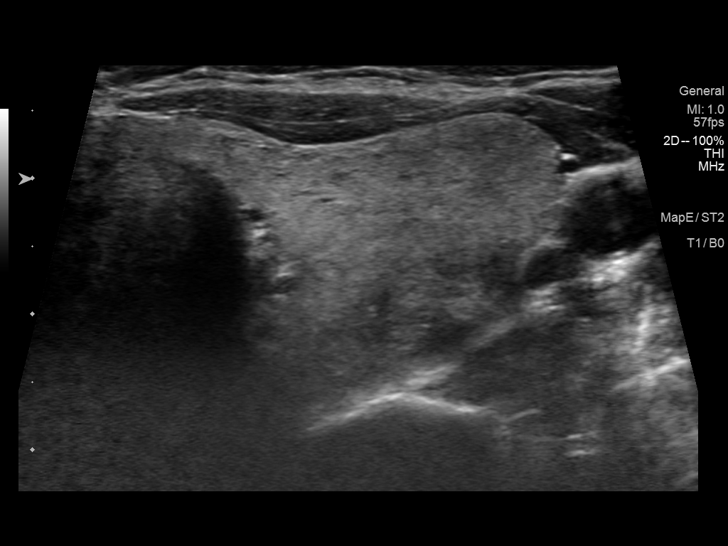
[im 51/87]
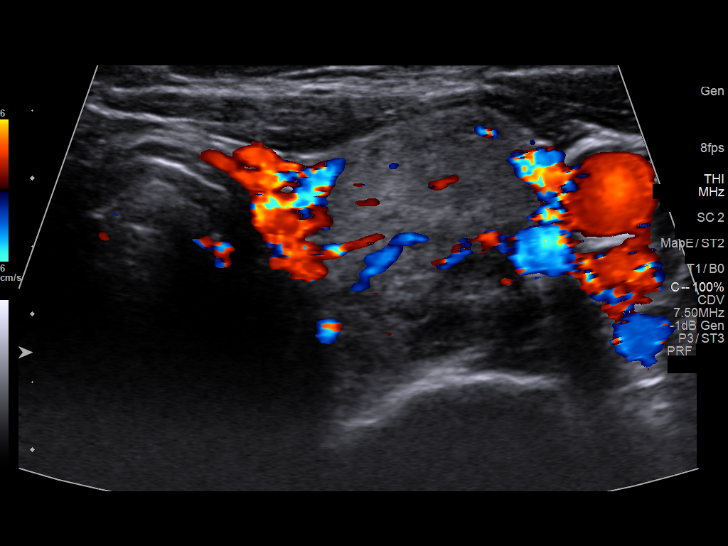
[im 58/87]
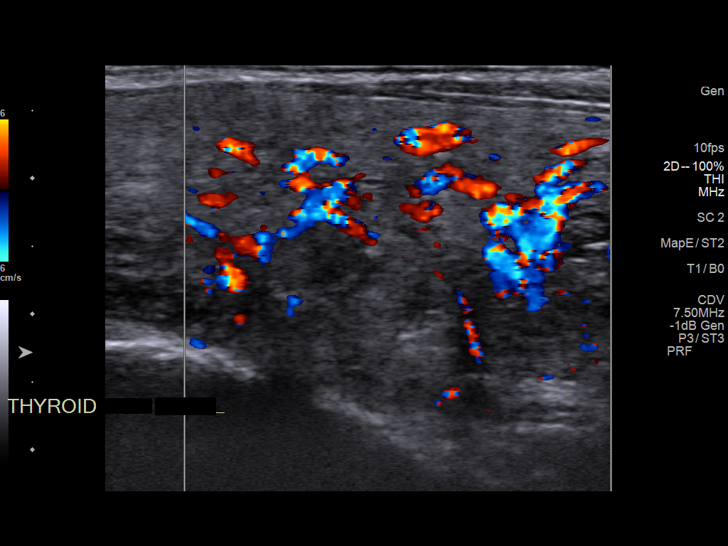
[im 65/87]
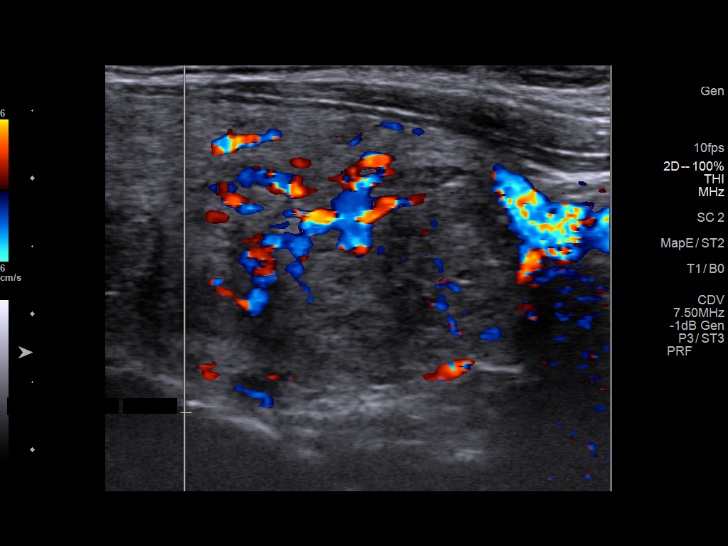
[im 72/87]
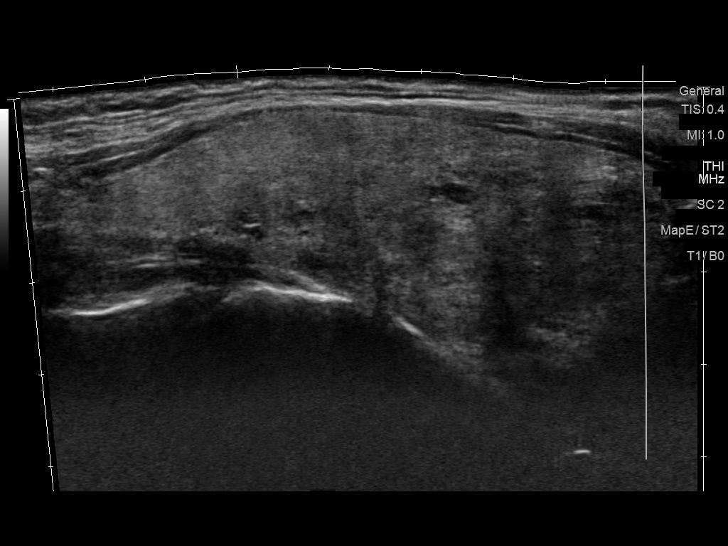
[im 79/87]
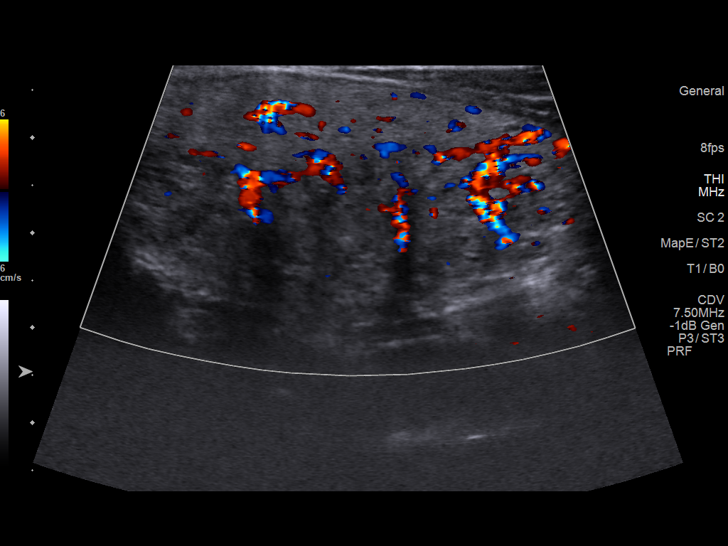
[im 87/87]
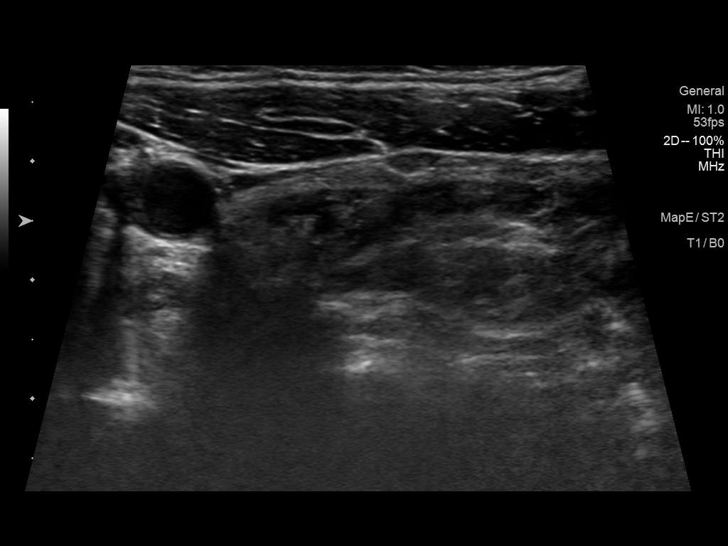

[13 of 25 positions shown; findings below may reference images not displayed]

FINDINGS: Parenchymal Echotexture: Moderately heterogenous

Isthmus: 0.5 cm thickness, stable

Right lobe: 6.6 x 2.3 x 2.1 cm, previously 6.6 x 2.3 x

Left lobe: 6.5 x 2.6 x 2.7 cm, previously 6.2 x 2.4 x

_________________________________________________________

Estimated total number of nodules >/= 1 cm: 4

Number of spongiform nodules >/=  2 cm not described below (TR1): 0

Number of mixed cystic and solid nodules >/= 1.5 cm not described
below (TR2): 0

_________________________________________________________

Nodule # 1:

Prior biopsy: No

Location: Right; Mid

Maximum size: 2.5 cm; Other 2 dimensions: 1.8 x 1.9 cm, previously,
2.3 x 1.6 x 2.1 cm

Composition: solid/almost completely solid (2)

Echogenicity: isoechoic (1)

Shape: not taller-than-wide (0)

Margins: ill-defined (0)

Echogenic foci: none (0)

ACR TI-RADS total points: 3.

ACR TI-RADS risk category:  TR3 (3 points).

Significant change in size (>/= 20% in two dimensions and minimal
increase of 2 mm): No

Change in features: No

Change in ACR TI-RADS risk category: No

ACR TI-RADS recommendations:

**Given size (>/= 2.5 cm) and appearance, fine needle aspiration of
this mildly suspicious nodule should be considered based on TI-RADS
criteria.

_________________________________________________________

Nodule # 2: 1.5 x 1.4 x 0.9 cm superior left, previously 1.8 x 1.4 x
1; this was previously biopsied

Nodule # 3: 1.9 x 1.4 x 1.6 cm mid left, previously 2.1 x 1.6 x 2.1;
this was previously biopsied

Nodule # 4:

Prior biopsy: No

Location: Left; Inferior

Maximum size: 2 cm; Other 2 dimensions: 1.5 x 1.8 cm, previously,
1.7 x 1.4 x 1.5 cm

Composition: solid/almost completely solid (2)

Echogenicity: hypoechoic (2)

Shape: not taller-than-wide (0)

Margins: ill-defined (0)

Echogenic foci: none (0)

ACR TI-RADS total points: 4.

ACR TI-RADS risk category:  TR4 (4-6 points).

Significant change in size (>/= 20% in two dimensions and minimal
increase of 2 mm): No

Change in features: Yes

Change in ACR TI-RADS risk category: Yes

ACR TI-RADS recommendations:

**Given size (>/= 1.5 cm) and appearance, fine needle aspiration of
this moderately suspicious nodule should be considered based on
TI-RADS criteria.
IMPRESSION: 1. Thyromegaly with stable nodules.
2. Recommend FNA biopsy of 2.5 cm mid right AND 2 cm inferior left
nodules.

The above is in keeping with the ACR TI-RADS recommendations - [HOSPITAL] 1942;[DATE].

## 2024-02-18 IMAGING — US US THYROID
1 series · 12 of 25 positions shown · non-contrast
Comparison: Prior thyroid ultrasound 04/23/2020 and 04/21/2018

CLINICAL DATA: Prior ultrasound follow-up. Multinodular thyroid
gland. Patient previously underwent biopsy of left superior and left
mid thyroid nodules on 04/23/2019

EXAM:
THYROID ULTRASOUND
TECHNIQUE: Ultrasound examination of the thyroid gland and adjacent soft
tissues was performed.

[Series 1: us thyroid · 0.06mm/px · 12 of 41 slices shown]
[im 2/41]
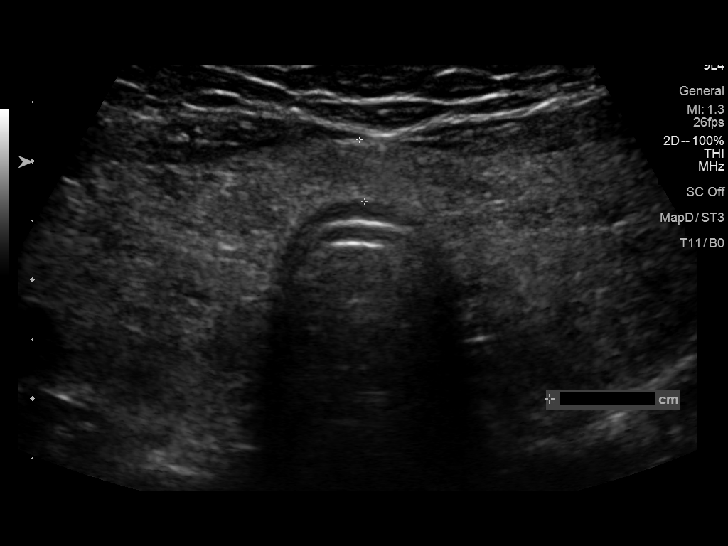
[im 6/41]
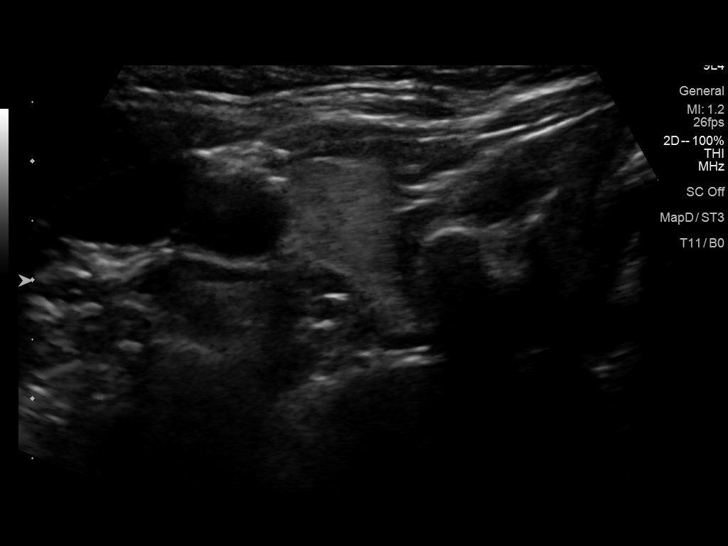
[im 9/41]
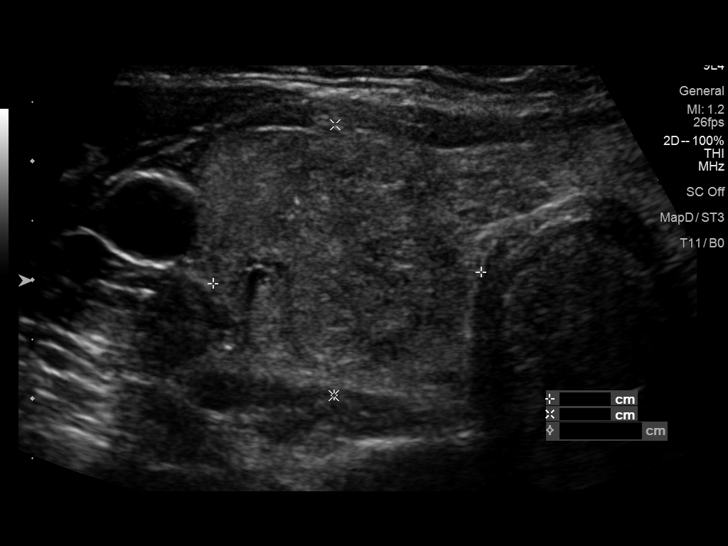
[im 12/41]
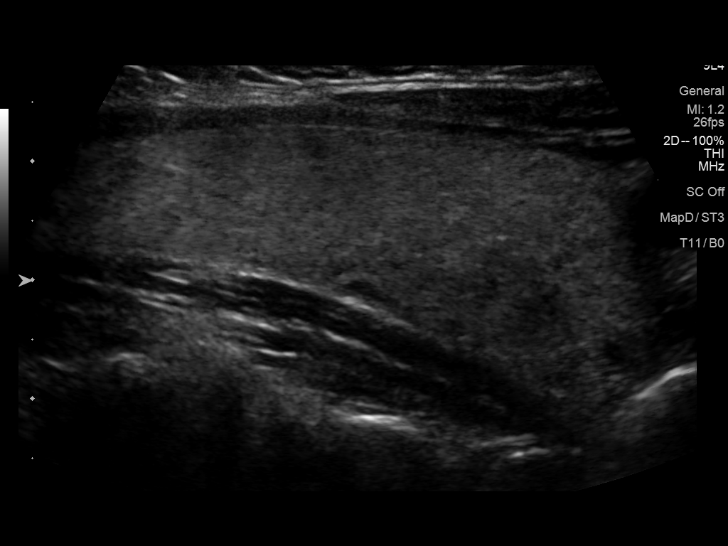
[im 16/41]
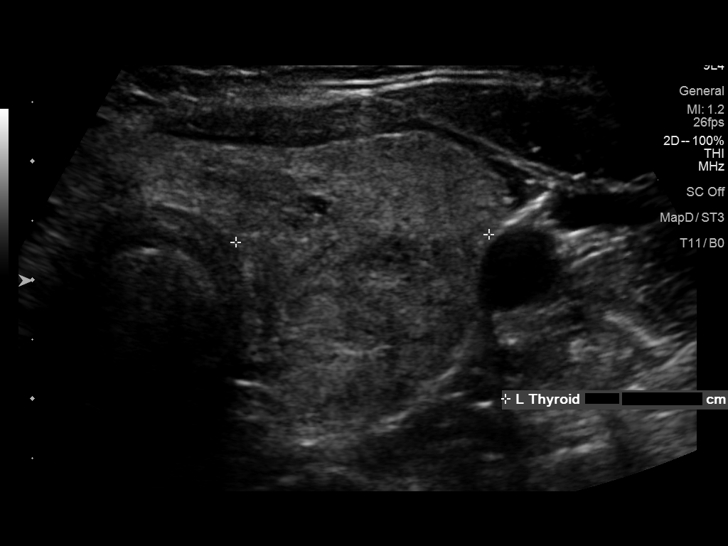
[im 19/41]
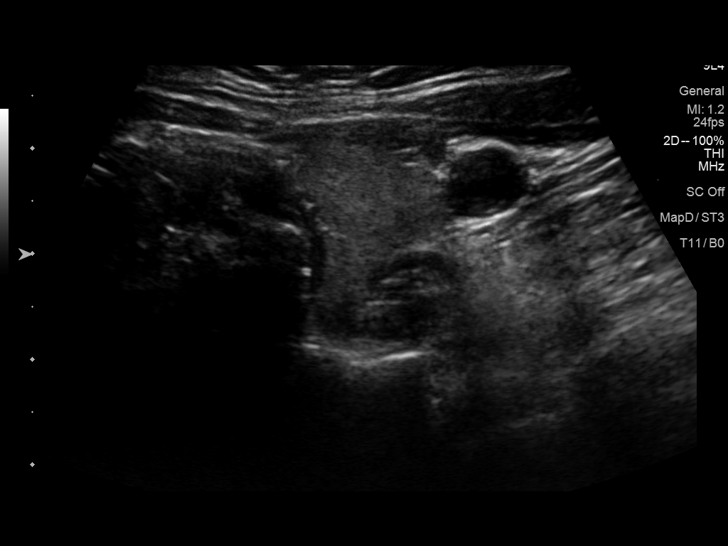
[im 22/41]
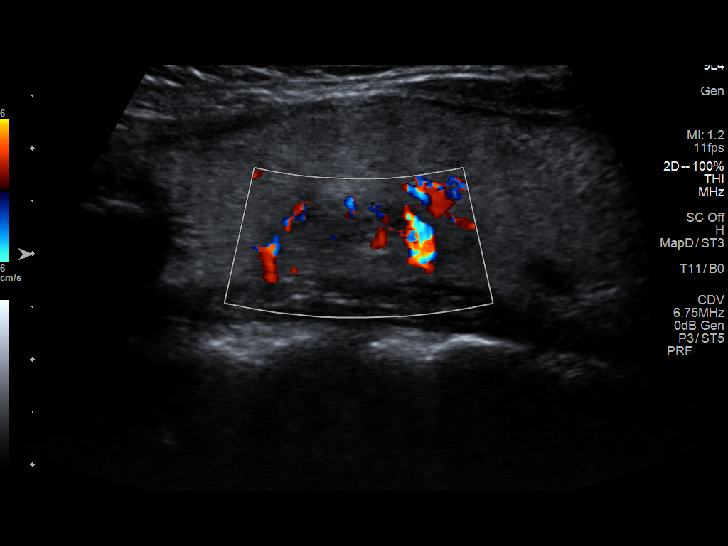
[im 26/41]
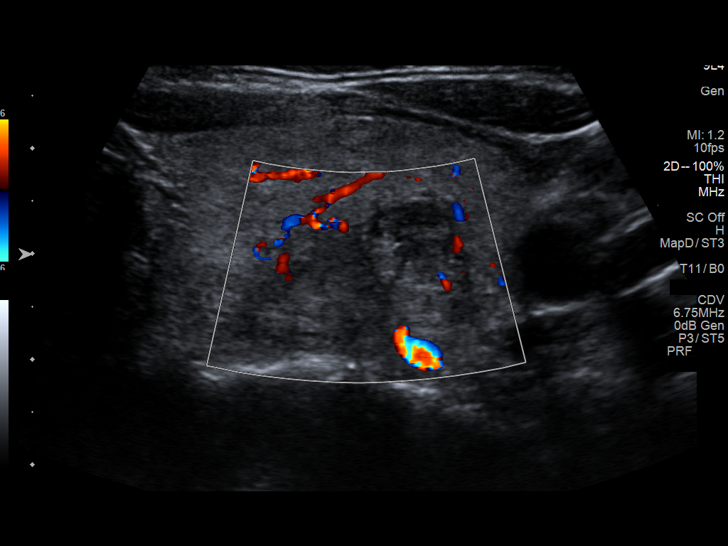
[im 29/41]
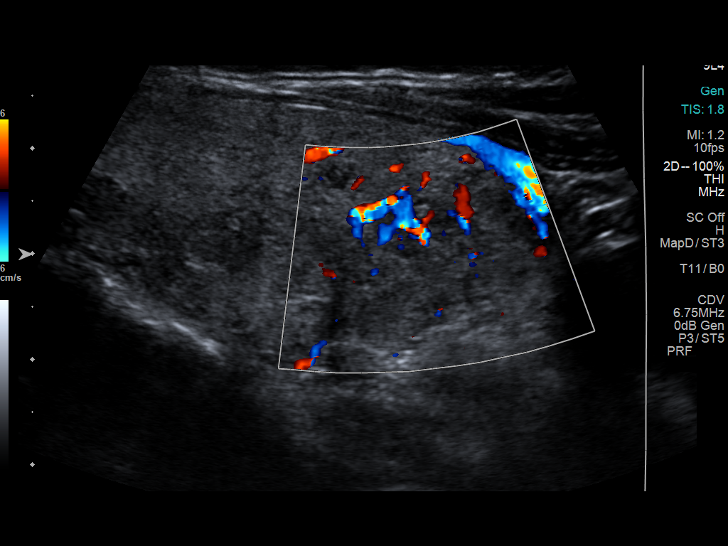
[im 32/41]
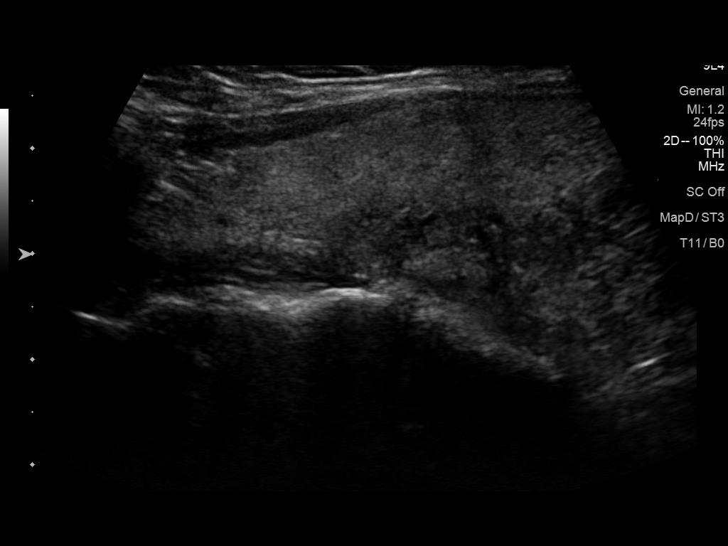
[im 36/41]
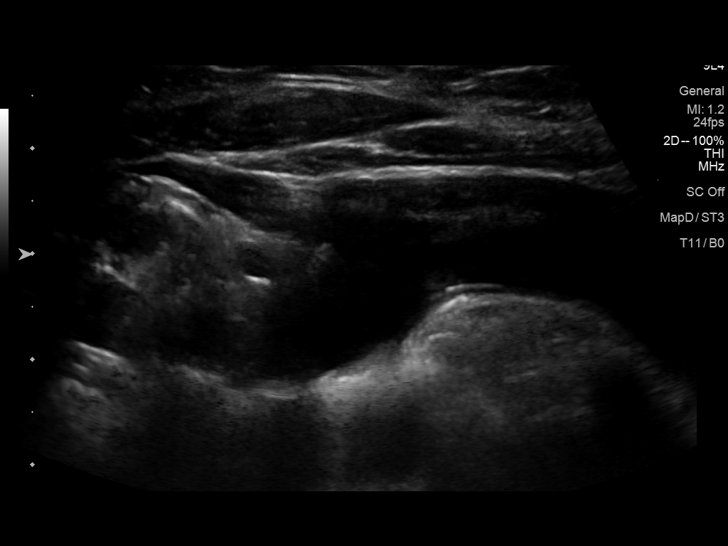
[im 39/41]
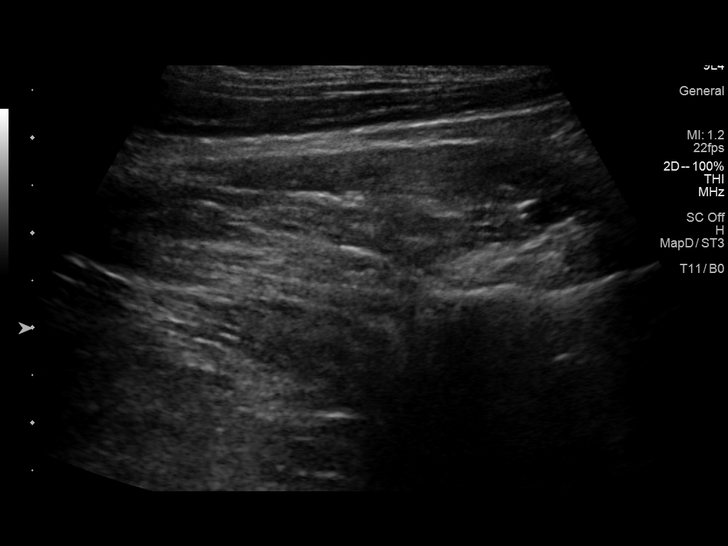

[12 of 25 positions shown; findings below may reference images not displayed]

FINDINGS: Parenchymal Echotexture: Moderately heterogenous

Isthmus: 0.5 cm

Right lobe: 6.3 x 2.3 x 2.4 cm

Left lobe: 7.2 x 3.0 x 2.1 cm

_________________________________________________________

Estimated total number of nodules >/= 1 cm: 4

Number of spongiform nodules >/=  2 cm not described below (TR1): 0

Number of mixed cystic and solid nodules >/= 1.5 cm not described
below (TR2): 0

_________________________________________________________

Nodule # 1:

Prior biopsy: No

Location: Right; Inferior

Maximum size: 2.4 cm; Other 2 dimensions: 2.3 x 2.3 cm, previously,
2.5 x 2.0 x 1.8 cm

Composition: solid/almost completely solid (2)

Echogenicity: isoechoic (1)

Shape: not taller-than-wide (0)

Margins: ill-defined (0)

Echogenic foci: none (0)

ACR TI-RADS total points: 3.

ACR TI-RADS risk category:  TR3 (3 points).

Significant change in size (>/= 20% in two dimensions and minimal
increase of 2 mm): No

Change in features: No

Change in ACR TI-RADS risk category: No

ACR TI-RADS recommendations:

*Given size (>/= 1.5 - 2.4 cm) and appearance, a follow-up
ultrasound in 1 year should be considered based on TI-RADS criteria.

_________________________________________________________

Nodule # 2: Previously biopsied nodule in the left upper gland is
similar at 1.5 x 1.5 x 1.0 cm.

Nodule # 3: Stable appearance of previously biopsied nodule in the
left mid gland at approximately 2.1 x 1.8 x 1.5 cm.

Nodule # 4:

Prior biopsy: No

Location: Left; Inferior

Maximum size: 2.1 cm; Other 2 dimensions: 1.8 x 1.6 cm, previously,
2.0 x 1.8 x 1.5 cm

Composition: solid/almost completely solid (2)

Echogenicity: isoechoic (1)

Shape: not taller-than-wide (0)

Margins: ill-defined (0)

Echogenic foci: none (0)

ACR TI-RADS total points: 3.

ACR TI-RADS risk category:  TR3 (3 points).

Significant change in size (>/= 20% in two dimensions and minimal
increase of 2 mm): No

Change in features: No

Change in ACR TI-RADS risk category: No

ACR TI-RADS recommendations:

*Given size (>/= 1.5 - 2.4 cm) and appearance, a follow-up
ultrasound in 1 year should be considered based on TI-RADS criteria.

_________________________________________________________

No new nodules or suspicious features.
IMPRESSION: 1. No significant interval change in the size or appearance of
previously biopsied nodules in the left superior and left mid gland.
2. Similar to slightly decreased size of TI-RADS category 3 nodule
in the right lower gland. Given general stability dating back to
2121, continued surveillance is a reasonable approach.
3. 2.1 cm TI-RADS category 3 nodule in the left inferior gland
demonstrates no interval change in size compared to prior. The
nodule appears more isoechoic on today's exam which down grades it
from TR 4 to TR 3. This lesion also meets criteria for continued
imaging surveillance. Recommend follow-up ultrasound in 1 year.

The above is in keeping with the ACR TI-RADS recommendations - [HOSPITAL] 8582;[DATE].
